# Patient Record
Sex: Female | Born: 1970
Health system: Southern US, Community
[De-identification: ages and names within clinical notes are randomized; demographics above are authoritative.]

## PROBLEM LIST (undated history)

## (undated) DIAGNOSIS — K921 Melena: Secondary | ICD-10-CM

## (undated) DIAGNOSIS — Z789 Other specified health status: Secondary | ICD-10-CM

## (undated) DIAGNOSIS — Z460 Encounter for fitting and adjustment of spectacles and contact lenses: Secondary | ICD-10-CM

## (undated) HISTORY — DX: Melena: K92.1

## (undated) HISTORY — PX: INCISION AND DRAINAGE ABSCESS: SHX5864

---

## 1999-04-14 ENCOUNTER — Emergency Department (HOSPITAL_COMMUNITY): Admission: EM | Admit: 1999-04-14 | Discharge: 1999-04-14 | Payer: Self-pay | Admitting: Emergency Medicine

## 1999-12-04 ENCOUNTER — Emergency Department (HOSPITAL_COMMUNITY): Admission: EM | Admit: 1999-12-04 | Discharge: 1999-12-04 | Payer: Self-pay | Admitting: Emergency Medicine

## 2000-05-21 ENCOUNTER — Encounter: Payer: Self-pay | Admitting: Emergency Medicine

## 2000-05-21 ENCOUNTER — Other Ambulatory Visit: Admission: RE | Admit: 2000-05-21 | Discharge: 2000-05-21 | Payer: Self-pay | Admitting: Internal Medicine

## 2000-05-21 ENCOUNTER — Emergency Department (HOSPITAL_COMMUNITY): Admission: EM | Admit: 2000-05-21 | Discharge: 2000-05-21 | Payer: Self-pay | Admitting: Emergency Medicine

## 2000-05-22 ENCOUNTER — Emergency Department (HOSPITAL_COMMUNITY): Admission: EM | Admit: 2000-05-22 | Discharge: 2000-05-22 | Payer: Self-pay | Admitting: Emergency Medicine

## 2000-05-28 ENCOUNTER — Emergency Department (HOSPITAL_COMMUNITY): Admission: EM | Admit: 2000-05-28 | Discharge: 2000-05-28 | Payer: Self-pay | Admitting: Emergency Medicine

## 2001-03-06 ENCOUNTER — Emergency Department (HOSPITAL_COMMUNITY): Admission: EM | Admit: 2001-03-06 | Discharge: 2001-03-07 | Payer: Self-pay | Admitting: Emergency Medicine

## 2001-05-02 ENCOUNTER — Other Ambulatory Visit: Admission: RE | Admit: 2001-05-02 | Discharge: 2001-05-02 | Payer: Self-pay | Admitting: Obstetrics and Gynecology

## 2002-06-10 ENCOUNTER — Other Ambulatory Visit: Admission: RE | Admit: 2002-06-10 | Discharge: 2002-06-10 | Payer: Self-pay | Admitting: Obstetrics and Gynecology

## 2004-07-10 ENCOUNTER — Other Ambulatory Visit: Admission: RE | Admit: 2004-07-10 | Discharge: 2004-07-10 | Payer: Self-pay | Admitting: Obstetrics and Gynecology

## 2006-11-08 ENCOUNTER — Encounter: Admission: RE | Admit: 2006-11-08 | Discharge: 2006-11-08 | Payer: Self-pay | Admitting: Internal Medicine

## 2006-11-26 HISTORY — PX: OTHER SURGICAL HISTORY: SHX169

## 2006-12-19 ENCOUNTER — Ambulatory Visit (HOSPITAL_COMMUNITY): Admission: RE | Admit: 2006-12-19 | Discharge: 2006-12-19 | Payer: Self-pay | Admitting: Obstetrics and Gynecology

## 2006-12-19 ENCOUNTER — Encounter (INDEPENDENT_AMBULATORY_CARE_PROVIDER_SITE_OTHER): Payer: Self-pay | Admitting: *Deleted

## 2008-05-04 ENCOUNTER — Emergency Department (HOSPITAL_COMMUNITY): Admission: EM | Admit: 2008-05-04 | Discharge: 2008-05-04 | Payer: Self-pay | Admitting: Family Medicine

## 2008-11-26 HISTORY — PX: DIAGNOSTIC LAPAROSCOPY: SUR761

## 2009-02-17 ENCOUNTER — Ambulatory Visit (HOSPITAL_COMMUNITY): Admission: RE | Admit: 2009-02-17 | Discharge: 2009-02-17 | Payer: Self-pay | Admitting: Obstetrics and Gynecology

## 2011-03-08 LAB — PREGNANCY, URINE: Preg Test, Ur: NEGATIVE

## 2011-03-08 LAB — CBC
HCT: 37.2 % (ref 36.0–46.0)
Hemoglobin: 12.3 g/dL (ref 12.0–15.0)
MCHC: 33.1 g/dL (ref 30.0–36.0)
MCV: 93.2 fL (ref 78.0–100.0)
Platelets: 267 10*3/uL (ref 150–400)
RBC: 3.99 MIL/uL (ref 3.87–5.11)
RDW: 13 % (ref 11.5–15.5)
WBC: 7.1 10*3/uL (ref 4.0–10.5)

## 2011-04-10 NOTE — Op Note (Signed)
NAMETINESHA, Rachel Graham         ACCOUNT NO.:  [id number]   MEDICAL RECORD NO.:  [id number]          PATIENT TYPE:  AMB   LOCATION:  SDC                           FACILITY:  [facility]   PHYSICIAN:  Maxie Better, M.D.DATE OF BIRTH:  [birth year]   DATE OF PROCEDURE:  02/17/2009  DATE OF DISCHARGE:                               OPERATIVE REPORT   PREOPERATIVE DIAGNOSES:  Persistent left lower quadrant pain, complex  left ovarian cyst, question endometrioma.   PROCEDURES:  Diagnostic laparoscopy, left ovarian cystotomy, lysis of  adhesions.   POSTOPERATIVE DIAGNOSES:  Left ovarian endometrioma, pelvic adhesions,  left lower quadrant pain.   ANESTHESIA:  General.   SURGEON.:  Maxie Better, MD   ASSISTANT:  Eliberto Ivory. Dickstein, MD   PROCEDURE IN DETAIL:  Under adequate general anesthesia, the patient was  placed in the dorsal lithotomy position.  She was sterilely prepped and  draped in the usual fashion.  Bladder was catheterized for large amount  of urine.  Examination under anesthesia revealed an anteverted uterus,  left adnexal fullness, right normal.  Bivalve speculum was placed in the  vagina.  Single-tooth tenaculum was placed on the anterior lip of the  cervix and acorn cannula was introduced into the cervical os and  attached to the tenaculum for manipulation of the uterus.  The bivalve  speculum was removed.  Attention was then turned to the abdomen, 0.25%  Marcaine was injected infraumbilically.  Infraumbilical incision was  then made.  Veress needle was introduced and tested with saline.  Carbon  dioxide was insufflated after opening pressure of 4 was noted, 3 L of  carbon dioxide was insufflated.  Veress needle was removed.  A 10-mm  disposable trocar with sleeve was introduced into the abdomen without  incident.  A lighted video laparoscope was then placed through that  port.  Panoramic inspection was then performed.  Normal liver edge  noted.  Normal appendix.   The uterus was normal.  Right tube and ovary  was normal.  Left ovary was enlarged with cystic mass.  A 0.25% Marcaine  was injected in the lower quadrants, right and left, and small incision  was made, 5-mm ports were placed under direct visualization to the both  sites.  At that point, the cervix was further inspected.  There is  partial obliteration of the posterior cul-de-sac on the left due to  adhesion of the rectosigmoid to the posterior aspect of the uterus along  with the left uterosacral.  The ovary was attached to the lateral aspect  of the uterus and also to the bowels.  Some adhesions around it was  lysed in order to help to try to facilitate and gave a little bit of  mobility.  However, due to the bowel and the fact that the patient not  prepped further, sharp dissection could not be performed without concern  for perforation of the bowels.  While that was being performed, the cyst  incidentally ruptured and large amount of chocolate fluid was removed.  The cyst cavity was inspected and there was no cyst wall to be removed  that could be easily  removed.  At that point, the cavity was irrigated  and checked for bleeding.  The rest of the pelvis had no evidence of  endometriosis visually.  The appendix again appeared normal.  The right  tube and ovary was normal.  The uterus itself was normal, and the  abdomen was then irrigated copiously, suctioned debris, and 30 mL of  0.25% Marcaine was instilled in the abdomen for pain management.  The  lower ports were then removed.  The abdomen was deflated.  The  infraumbilical site was removed.  The rectus fascia was identified and  grasped with Kocher and 0 Vicryl.  Figure-of-eight suture was then  placed.  The rest of the incisions  were then closed with subcuticular 4-0 Vicryl sutures.  The instruments  of the vagina was removed.  Specimen was none.  Estimated blood loss  minimal.  Complication was none.  The patient tolerated the  procedure  well and was transferred to recovery room in stable condition.      Maxie Better, M.D.  Electronically Signed     /MEDQ  D:  02/17/2009  T:  02/18/2009  Job:  [id number]

## 2011-04-13 NOTE — Op Note (Signed)
Rachel Graham, Rachel Graham         ACCOUNT NO.:  [id number]   MEDICAL RECORD NO.:  [id number]          PATIENT TYPE:  AMB   LOCATION:  SDC                           FACILITY:  [facility]   PHYSICIAN:  Maxie Better, M.D.DATE OF BIRTH:  1971-05-26   DATE OF PROCEDURE:  12/19/2006  DATE OF DISCHARGE:                               OPERATIVE REPORT   PREOPERATIVE DIAGNOSIS:  Dysfunctional uterine bleeding, endometrial  mass.   PROCEDURE:  Diagnostic hysteroscopy, hysteroscopic resection of  endometrial polyps, dilation and curettage.   POSTOPERATIVE DIAGNOSIS:  Dysfunctional uterine bleeding, endometrial  polyps.   ANESTHESIA:  General and paracervical block.   SURGEON:  Maxie Better, M.D.   PROCEDURE:  Under adequate general anesthesia, the patient is placed in  the dorsal lithotomy position.  She was sterilely prepped and draped in  usual fashion.  The bladder was catheterized for large amount of urine.  Bimanual examination revealed an anteverted uterus without any adnexal  masses appreciated.  Bivalve speculum was placed in the vagina.  One-  percent Nesacaine was injected paracervically at 3 and 9 o'clock.  The  anterior lip of the cervix was then grasped with a single-tooth  tenaculum.  The cervix was then serially dilated up to #23 [facility]  dilator.  The small diagnostic hysteroscope was introduced into the  uterine cavity.  Polypoid lesions were then noted, particularly from the  anterior wall of the uterine cavity.  The diagnostic hysteroscope was  then removed.  The cervix was then further dilated up to #25 [facility]  dilator, and the resectoscope was introduced.  Polypoid lesions were  resected.  The tubal ostia could be seen.  The resectoscope was removed.  The cavity was then curetted.  Resectoscope was then reinserted.  The  endometrial cavity showed no lesions.  On pulling out toward the lower  uterine segment on the right, there was a polypoid lesion.  This was  then  resected.  Further pulling out of the resectoscope into the  endocervical canal did not reveal any other lesions.  The resectoscope  was then removed.  The cavity was then curetted.  All instruments were  then removed from the vagina.  Specimen labeled endometrial polyps and  endometrial curetting was sent to pathology.  Estimated blood loss was  minimal.  Fluid deficit was 195 mL.  Complication was none.  The patient  tolerated the procedure well, was transferred to recovery room in stable  condition.      Maxie Better, M.D.  Electronically Signed     Yarnell/MEDQ  D:  12/19/2006  T:  12/19/2006  Job:  [id number]   cc:   Merlene Laughter. Renae Gloss, M.D.  Fax: [phone]

## 2011-05-18 ENCOUNTER — Other Ambulatory Visit: Payer: Self-pay | Admitting: Obstetrics and Gynecology

## 2011-08-23 LAB — POCT URINALYSIS DIP (DEVICE)
Glucose, UA: NEGATIVE
Operator id: 247071
Protein, ur: 30 — AB
Specific Gravity, Urine: 1.02
Urobilinogen, UA: 1

## 2012-12-09 ENCOUNTER — Encounter (HOSPITAL_COMMUNITY): Payer: Self-pay | Admitting: Emergency Medicine

## 2012-12-09 ENCOUNTER — Emergency Department (INDEPENDENT_AMBULATORY_CARE_PROVIDER_SITE_OTHER): Payer: 59

## 2012-12-09 ENCOUNTER — Emergency Department (HOSPITAL_COMMUNITY)
Admission: EM | Admit: 2012-12-09 | Discharge: 2012-12-09 | Disposition: A | Discharge status: Home / Self Care | Payer: 59 | Source: Home / Self Care | Attending: Emergency Medicine | Admitting: Emergency Medicine

## 2012-12-09 DIAGNOSIS — S39012A Strain of muscle, fascia and tendon of lower back, initial encounter: Secondary | ICD-10-CM

## 2012-12-09 DIAGNOSIS — S335XXA Sprain of ligaments of lumbar spine, initial encounter: Secondary | ICD-10-CM

## 2012-12-09 LAB — POCT URINALYSIS DIP (DEVICE)
Bilirubin Urine: NEGATIVE
Glucose, UA: NEGATIVE mg/dL
Nitrite: NEGATIVE
Urobilinogen, UA: 0.2 mg/dL (ref 0.0–1.0)

## 2012-12-09 MED ORDER — METHOCARBAMOL 500 MG PO TABS: 500.0000 mg | ORAL_TABLET | Freq: Three times a day (TID) | ORAL | Status: DC

## 2012-12-09 MED ORDER — MELOXICAM 15 MG PO TABS: 15.0000 mg | ORAL_TABLET | Freq: Every day | ORAL | Status: DC

## 2012-12-09 MED ORDER — TRAMADOL HCL 50 MG PO TABS: 100.0000 mg | ORAL_TABLET | Freq: Three times a day (TID) | ORAL | Status: DC | PRN

## 2012-12-09 NOTE — ED Notes (Signed)
Pt c/o severe lower back pain over the past month off/on. Pt states the past two weeks it has seem to be a lot worse. Pt denies any urinary symptoms. Pt does state that urine is dark in color.  Pt has tried otc pain meds with no relief in symptoms.   Denies hx of uti"s.

## 2012-12-09 NOTE — ED Provider Notes (Signed)
Chief Complaint  Patient presents with  . Back Pain    severe lower back pain x 1 month on/off. last two weeks seems alot worse.     History of Present Illness:   Rachel Graham is a 42 year old female who has had a one half month history of lower back pain which radiates down both legs as far as the knees. It's been worse the past 2 weeks. She denies any injury. 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 She is sitting most of the day, doing computer work. The pain is worse with any type of movement, bending, lifting, twisting, or bending. It's better if she lies flat on her back or walks. Her legs feel a little weak but she denies any numbness or tingling. She's had no dysuria, frequency, hematuria, or incontinence of bladder, bowel, or saddle anesthesia. She denies any abdominal pain. She's had no fever, chills, unexplained weight loss, history of cancer, history of immunosuppression, osteoporosis.  Review of Systems:  Other than noted above, the patient denies any of the following symptoms: Systemic:  No fever, chills, severe fatigue, or unexplained weight loss. GI:  No abdominal pain, nausea, vomiting, diarrhea, constipation, incontinence of bowel, or blood in stool. GU:  No dysuria, frequency, urgency, or hematuria. No incontinence of urine or difficulty urinating.  M-S:  No neck pain, joint pain, arthritis, or myalgias. Neuro:  No paresthesias, saddle anesthesia, muscular weakness, or progressive neurological deficit.  PMFSH:  Past medical history, family history, social history, meds, and allergies were reviewed. Specifically, there is no history of cancer, major trauma, osteoporosis, immunosuppression, HIV, or IV or injection drug use.   Physical Exam:   Vital signs:  LMP 12/05/2012 General:  Alert, oriented, in no distress. Abdomen:  Soft, non-tender.  No organomegaly or mass.  No pulsatile midline abdominal mass or bruit. Back:  There is  moderate tenderness to palpation in the paravertebral muscles just above the iliac crest bilaterally. There was no midline tenderness to palpation. She has 110 of flexion, 10 of extension, 20 of lateral bending, and 45 of rotation, with not much pain on flexion but, extension, lateral bending, and rotation. Straight leg raising on the left produces some lower back pain but no radiating pain. Lasegue's sign and popliteal compression are negative. Straight leg raising on the right was negative. Neuro:  Normal muscle strength, sensations and DTRs. Extremities: Pedal pulses were full, there was no edema. Skin:  Clear, warm and dry.  No rash.  Labs:   Results for orders placed during the hospital encounter of 12/09/12  POCT URINALYSIS DIP (DEVICE)      Component Value Range   Glucose, UA NEGATIVE  NEGATIVE mg/dL   Bilirubin Urine NEGATIVE  NEGATIVE   Ketones, ur NEGATIVE  NEGATIVE mg/dL   Specific Gravity, Urine 1.015  1.005 - 1.030   Hgb urine dipstick SMALL (*) NEGATIVE   pH 5.5  5.0 - 8.0   Protein, ur NEGATIVE  NEGATIVE mg/dL   Urobilinogen, UA 0.2  0.0 - 1.0 mg/dL   Nitrite NEGATIVE  NEGATIVE   Leukocytes, UA NEGATIVE  NEGATIVE     Radiology:  Dg Lumbar Spine Complete  12/09/2012  *RADIOLOGY REPORT*  Clinical Data: Low back pain  LUMBAR SPINE - COMPLETE 4+ VIEW  Comparison: None.  Findings: Frontal, lateral, bilateral oblique, and spot lumbosacral lateral images were obtained.  There are five non-rib bearing lumbar type vertebral bodies.  There is no fracture or  spondylolisthesis.  Disc spaces appear intact.  There is no appreciable facet arthropathy.  Impression:  No fracture.  No appreciable arthropathy.   Original Report Authenticated By: Bretta Bang, M.D.     Assessment:  The encounter diagnosis was Lumbar strain.  Plan:   1.  The following meds were prescribed:   New Prescriptions   MELOXICAM (MOBIC) 15 MG TABLET    Take 1 tablet (15 mg total) by mouth daily.    METHOCARBAMOL (ROBAXIN) 500 MG TABLET    Take 1 tablet (500 mg total) by mouth 3 (three) times daily.   TRAMADOL (ULTRAM) 50 MG TABLET    Take 2 tablets (100 mg total) by mouth every 8 (eight) hours as needed for pain.   2.  The patient was instructed in symptomatic care and handouts were given. 3.  The patient was told to return if becoming worse in any way, if no better in 2 weeks, and given some red flag symptoms that would indicate earlier return. 4.  The patient was encouraged to try to be as active as possible and given some exercises to do followed by moist heat.  Follow up:  The patient was told to follow up with Dr. Jonell Cluck in one week if no improvement.     Reuben Likes, MD 12/09/12 [phone]

## 2012-12-10 LAB — URINE CULTURE: Colony Count: 25000

## 2012-12-23 ENCOUNTER — Emergency Department (HOSPITAL_COMMUNITY)
Admission: EM | Admit: 2012-12-23 | Discharge: 2012-12-23 | Disposition: A | Discharge status: Home / Self Care | Payer: 59 | Source: Home / Self Care | Attending: Emergency Medicine | Admitting: Emergency Medicine

## 2012-12-23 ENCOUNTER — Encounter (HOSPITAL_COMMUNITY): Payer: Self-pay | Admitting: *Deleted

## 2012-12-23 DIAGNOSIS — H00019 Hordeolum externum unspecified eye, unspecified eyelid: Secondary | ICD-10-CM

## 2012-12-23 MED ORDER — TOBRAMYCIN 0.3 % OP SOLN: 1.0000 [drp] | OPHTHALMIC | Status: DC

## 2012-12-23 MED ORDER — CEPHALEXIN 500 MG PO CAPS: 500.0000 mg | ORAL_CAPSULE | Freq: Three times a day (TID) | ORAL | Status: DC

## 2012-12-23 MED ORDER — ERYTHROMYCIN 5 MG/GM OP OINT: TOPICAL_OINTMENT | Freq: Every day | OPHTHALMIC | Status: DC

## 2012-12-23 NOTE — ED Notes (Signed)
Pt reports swelling , redness and pustule on left eye for the past week with no relief from hot compresses

## 2012-12-23 NOTE — ED Provider Notes (Signed)
Chief Complaint  Patient presents with  . Eye Problem    History of Present Illness:   Rachel Graham is a 42 year old female who presents today with a one-week history of a painful papule on her left upper eyelid. This was not draining any pus. Her vision is normal. The globe itself is normal with no injection or erythema of the conjunctiva. There is no discharge in the conjunctiva. The right is normal. She's never had anything like this before.  Review of Systems:  Other than noted above, the patient denies any of the following symptoms: Systemic:  No fever, chills, sweats, fatigue, or weight loss. Eye:  No redness, eye pain, photophobia, discharge, blurred vision, or diplopia. ENT:  No nasal congestion, rhinorrhea, or sore throat. Lymphatic:  No adenopathy. Skin:  No rash or pruritis.  PMFSH:  Past medical history, family history, social history, meds, and allergies were reviewed.  Physical Exam:   Vital signs:  BP 140/89  Pulse 84  Temp 96.9 F (36.1 C) (Oral)  Resp 18  SpO2 99%  LMP 12/05/2012 General:  Alert and in no distress. Eye:  There is a large, firm, erythematous papule on the mid left upper eyelid. This was not fluctuant or draining pus. The periorbital tissues are normal, conjunctiva is normal with no injection or erythema, no discharge or drainage in the conjunctival sac. The cornea is intact, anterior chambers normal, PERRLA, full EOMs. ENT:  TMs and canals clear.  Nasal mucosa normal.  No intra-oral lesions, mucous membranes moist, pharynx clear. Neck:  No adenopathy tenderness or mass. Skin:  Clear, warm and dry.  Assessment:  The encounter diagnosis was Hordeolum.  Plan:   1.  The following meds were prescribed:   New Prescriptions   CEPHALEXIN (KEFLEX) 500 MG CAPSULE    Take 1 capsule (500 mg total) by mouth 3 (three) times daily.   ERYTHROMYCIN OPHTHALMIC OINTMENT    Place into the left eye at bedtime.   TOBRAMYCIN (TOBREX) 0.3 % OPHTHALMIC SOLUTION     Place 1 drop into the left eye every 4 (four) hours.   2.  The patient was instructed in symptomatic care and handouts were given. 3.  The patient was told to return if becoming worse in any way, if no better in 3 or 4 days, and given some red flag symptoms that would indicate earlier return.  Follow up:  The patient was told to follow up with Dr. Randon Goldsmith if no improvement in one week.      Reuben Likes, MD 12/23/12 1051

## 2012-12-31 ENCOUNTER — Emergency Department (HOSPITAL_COMMUNITY)
Admission: EM | Admit: 2012-12-31 | Discharge: 2012-12-31 | Disposition: A | Discharge status: Home / Self Care | Payer: 59 | Source: Home / Self Care | Attending: Family Medicine | Admitting: Family Medicine

## 2012-12-31 ENCOUNTER — Encounter (HOSPITAL_COMMUNITY): Payer: Self-pay | Admitting: *Deleted

## 2012-12-31 DIAGNOSIS — L02419 Cutaneous abscess of limb, unspecified: Secondary | ICD-10-CM

## 2012-12-31 DIAGNOSIS — IMO0002 Reserved for concepts with insufficient information to code with codable children: Secondary | ICD-10-CM

## 2012-12-31 NOTE — ED Notes (Signed)
Boil under left arm x 4 days - started draining yesterday.  Went to [facility]. Today for stye left eye - he told her to come here and get her underarm cultured and to have the "core" removed

## 2012-12-31 NOTE — ED Provider Notes (Signed)
History     CSN: [id number]  Arrival date & time 12/31/12  1521   First MD Initiated Contact with Patient 12/31/12 1603      Chief Complaint  Patient presents with  . Recurrent Skin Infections    (Consider location/radiation/quality/duration/timing/severity/associated sxs/prior treatment) Patient is a 42 y.o. female presenting with abscess. The history is provided by the patient.  Abscess  This is a recurrent problem. The current episode started less than one week ago. The onset was gradual. The problem has been gradually worsening. The abscess is present on the right arm. The problem is mild. The abscess is characterized by draining.    History reviewed. No pertinent past medical history.  Past Surgical History  Procedure Date  . Fibroids surgery     History reviewed. No pertinent family history.  History  Substance Use Topics  . Smoking status: Never Smoker   . Smokeless tobacco: Not on file  . Alcohol Use: Yes     Comment: occasional    OB History    Grav Para Term Preterm Abortions TAB SAB Ect Mult Living                  Review of Systems  Constitutional: Negative.     Allergies  Review of patient's allergies indicates no known allergies.  Home Medications   Current Outpatient Rx  Name  Route  Sig  Dispense  Refill  . CEPHALEXIN 500 MG PO CAPS   Oral   Take 1 capsule (500 mg total) by mouth 3 (three) times daily.   30 capsule   0   . ERYTHROMYCIN 5 MG/GM OP OINT   Left Eye   Place into the left eye at bedtime.   3.5 g   0   . TOBRAMYCIN SULFATE 0.3 % OP SOLN   Left Eye   Place 1 drop into the left eye every 4 (four) hours.   5 mL   0   . MELOXICAM 15 MG PO TABS   Oral   Take 1 tablet (15 mg total) by mouth daily.   15 tablet   0   . METHOCARBAMOL 500 MG PO TABS   Oral   Take 1 tablet (500 mg total) by mouth 3 (three) times daily.   30 tablet   0   . TRAMADOL HCL 50 MG PO TABS   Oral   Take 2 tablets (100 mg total) by mouth  every 8 (eight) hours as needed for pain.   30 tablet   0     BP 128/85  Pulse 66  Temp 99.4 F (37.4 C) (Oral)  SpO2 95%  LMP 12/05/2012  Physical Exam  Nursing note and vitals reviewed. Constitutional: She is oriented to person, place, and time. She appears well-developed and well-nourished. No distress.  Eyes: Conjunctivae normal and EOM are normal. Pupils are equal, round, and reactive to light.       Hordeolum left upper lid.  Neurological: She is alert and oriented to person, place, and time.  Skin: Skin is warm and dry.       Tiny droplet of fluid expressed from right axillary abscess area.    ED Course  Procedures (including critical care time)   Labs Reviewed  CULTURE, ROUTINE-ABSCESS   No results found.   1. Abscess of axilla       MDM          Linna Hoff, MD 12/31/12 [id number]

## 2013-01-03 LAB — CULTURE, ROUTINE-ABSCESS

## 2013-01-14 NOTE — ED Notes (Signed)
Abscess culture R axilla: Few Staph. species (coagulase neg.).  Message sent to Dr. Artis Flock. 2/21 Dr. Artis Flock said it was "OK." Rachel Graham 01/16/2013

## 2013-10-31 ENCOUNTER — Ambulatory Visit: Payer: 59

## 2014-04-21 ENCOUNTER — Other Ambulatory Visit: Payer: Self-pay | Admitting: Orthopedic Surgery

## 2014-04-22 ENCOUNTER — Encounter (HOSPITAL_BASED_OUTPATIENT_CLINIC_OR_DEPARTMENT_OTHER): Payer: Self-pay | Admitting: *Deleted

## 2014-04-22 NOTE — Progress Notes (Signed)
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

## 2014-04-27 ENCOUNTER — Ambulatory Visit (HOSPITAL_BASED_OUTPATIENT_CLINIC_OR_DEPARTMENT_OTHER): Payer: 59 | Admitting: Certified Registered"

## 2014-04-27 ENCOUNTER — Encounter (HOSPITAL_BASED_OUTPATIENT_CLINIC_OR_DEPARTMENT_OTHER): Payer: Self-pay | Admitting: *Deleted

## 2014-04-27 ENCOUNTER — Ambulatory Visit (HOSPITAL_BASED_OUTPATIENT_CLINIC_OR_DEPARTMENT_OTHER)
Admission: RE | Admit: 2014-04-27 | Discharge: 2014-04-27 | Disposition: A | Discharge status: Home / Self Care | Payer: 59 | Source: Ambulatory Visit | Attending: Orthopedic Surgery | Admitting: Orthopedic Surgery

## 2014-04-27 ENCOUNTER — Encounter (HOSPITAL_BASED_OUTPATIENT_CLINIC_OR_DEPARTMENT_OTHER): Payer: 59 | Admitting: Certified Registered"

## 2014-04-27 ENCOUNTER — Encounter (HOSPITAL_BASED_OUTPATIENT_CLINIC_OR_DEPARTMENT_OTHER)
Admission: RE | Disposition: A | Discharge status: Home / Self Care | Payer: Self-pay | Source: Ambulatory Visit | Attending: Orthopedic Surgery

## 2014-04-27 DIAGNOSIS — M6740 Ganglion, unspecified site: Secondary | ICD-10-CM | POA: Insufficient documentation

## 2014-04-27 HISTORY — PX: GANGLION CYST EXCISION: SHX1691

## 2014-04-27 HISTORY — DX: Encounter for fitting and adjustment of spectacles and contact lenses: Z46.0

## 2014-04-27 HISTORY — DX: Other specified health status: Z78.9

## 2014-04-27 LAB — POCT HEMOGLOBIN-HEMACUE: Hemoglobin: 12.9 g/dL (ref 12.0–15.0)

## 2014-04-27 SURGERY — EXCISION, GANGLION CYST, WRIST
Anesthesia: General | Site: Wrist | Laterality: Right

## 2014-04-27 MED ORDER — DEXAMETHASONE SODIUM PHOSPHATE 10 MG/ML IJ SOLN
INTRAMUSCULAR | Status: DC | PRN
  Administered 2014-04-27: 10 mg via INTRAVENOUS

## 2014-04-27 MED ORDER — FENTANYL CITRATE 0.05 MG/ML IJ SOLN
INTRAMUSCULAR | Status: DC | PRN
  Administered 2014-04-27 (×2): 25 ug via INTRAVENOUS
  Administered 2014-04-27: 50 ug via INTRAVENOUS
  Administered 2014-04-27: 25 ug via INTRAVENOUS
  Administered 2014-04-27: 50 ug via INTRAVENOUS
  Administered 2014-04-27: 25 ug via INTRAVENOUS

## 2014-04-27 MED ORDER — FENTANYL CITRATE 0.05 MG/ML IJ SOLN
INTRAMUSCULAR | Status: AC
  Filled 2014-04-27: qty 6

## 2014-04-27 MED ORDER — HYDROMORPHONE HCL PF 1 MG/ML IJ SOLN
0.2500 mg | INTRAMUSCULAR | Status: DC | PRN
  Administered 2014-04-27 (×2): 0.5 mg via INTRAVENOUS

## 2014-04-27 MED ORDER — LACTATED RINGERS IV SOLN
INTRAVENOUS | Status: DC
  Administered 2014-04-27 (×2): via INTRAVENOUS

## 2014-04-27 MED ORDER — CEFAZOLIN SODIUM-DEXTROSE 2-3 GM-% IV SOLR
2.0000 g | INTRAVENOUS | Status: AC
  Administered 2014-04-27: 2 g via INTRAVENOUS

## 2014-04-27 MED ORDER — OXYCODONE HCL 5 MG PO TABS
5.0000 mg | ORAL_TABLET | Freq: Once | ORAL | Status: AC | PRN
  Administered 2014-04-27: 5 mg via ORAL

## 2014-04-27 MED ORDER — MIDAZOLAM HCL 2 MG/2ML IJ SOLN: 1.0000 mg | INTRAMUSCULAR | Status: DC | PRN

## 2014-04-27 MED ORDER — LIDOCAINE HCL (CARDIAC) 20 MG/ML IV SOLN
INTRAVENOUS | Status: DC | PRN
  Administered 2014-04-27: 60 mg via INTRAVENOUS

## 2014-04-27 MED ORDER — BUPIVACAINE HCL (PF) 0.25 % IJ SOLN
INTRAMUSCULAR | Status: DC | PRN
  Administered 2014-04-27: 5 mL

## 2014-04-27 MED ORDER — FENTANYL CITRATE 0.05 MG/ML IJ SOLN: 50.0000 ug | INTRAMUSCULAR | Status: DC | PRN

## 2014-04-27 MED ORDER — PROPOFOL 10 MG/ML IV BOLUS
INTRAVENOUS | Status: DC | PRN
  Administered 2014-04-27: 180 mg via INTRAVENOUS

## 2014-04-27 MED ORDER — ONDANSETRON HCL 4 MG/2ML IJ SOLN: 4.0000 mg | Freq: Once | INTRAMUSCULAR | Status: DC | PRN

## 2014-04-27 MED ORDER — HYDROMORPHONE HCL PF 1 MG/ML IJ SOLN
INTRAMUSCULAR | Status: AC
  Filled 2014-04-27: qty 1

## 2014-04-27 MED ORDER — CHLORHEXIDINE GLUCONATE 4 % EX LIQD: 60.0000 mL | Freq: Once | CUTANEOUS | Status: DC

## 2014-04-27 MED ORDER — OXYCODONE HCL 5 MG/5ML PO SOLN: 5.0000 mg | Freq: Once | ORAL | Status: AC | PRN

## 2014-04-27 MED ORDER — OXYCODONE HCL 5 MG PO TABS
ORAL_TABLET | ORAL | Status: AC
  Filled 2014-04-27: qty 1

## 2014-04-27 MED ORDER — MIDAZOLAM HCL 2 MG/2ML IJ SOLN
INTRAMUSCULAR | Status: AC
  Filled 2014-04-27: qty 2

## 2014-04-27 MED ORDER — MIDAZOLAM HCL 5 MG/5ML IJ SOLN
INTRAMUSCULAR | Status: DC | PRN
  Administered 2014-04-27: 2 mg via INTRAVENOUS

## 2014-04-27 MED ORDER — OXYCODONE-ACETAMINOPHEN 5-325 MG PO TABS: ORAL_TABLET | ORAL | Status: DC

## 2014-04-27 MED ORDER — CEFAZOLIN SODIUM-DEXTROSE 2-3 GM-% IV SOLR
INTRAVENOUS | Status: AC
  Filled 2014-04-27: qty 50

## 2014-04-27 MED ORDER — ONDANSETRON HCL 4 MG/2ML IJ SOLN
INTRAMUSCULAR | Status: DC | PRN
  Administered 2014-04-27: 4 mg via INTRAVENOUS

## 2014-04-27 SURGICAL SUPPLY — 53 items
APL SKNCLS STERI-STRIP NONHPOA (GAUZE/BANDAGES/DRESSINGS) ×1
BANDAGE ELASTIC 3 VELCRO ST LF (GAUZE/BANDAGES/DRESSINGS) ×3 IMPLANT
BENZOIN TINCTURE PRP APPL 2/3 (GAUZE/BANDAGES/DRESSINGS) ×2 IMPLANT
BLADE MINI RND TIP GREEN BEAV (BLADE) IMPLANT
BLADE SURG 15 STRL LF DISP TIS (BLADE) ×2 IMPLANT
BLADE SURG 15 STRL SS (BLADE) ×6
BNDG CMPR 9X4 STRL LF SNTH (GAUZE/BANDAGES/DRESSINGS) ×1
BNDG CMPR MD 5X2 ELC HKLP STRL (GAUZE/BANDAGES/DRESSINGS)
BNDG ELASTIC 2 VLCR STRL LF (GAUZE/BANDAGES/DRESSINGS) IMPLANT
BNDG ESMARK 4X9 LF (GAUZE/BANDAGES/DRESSINGS) ×2 IMPLANT
BNDG GAUZE ELAST 4 BULKY (GAUZE/BANDAGES/DRESSINGS) ×3 IMPLANT
CHLORAPREP W/TINT 26ML (MISCELLANEOUS) ×3 IMPLANT
CLOSURE WOUND 1/2 X4 (GAUZE/BANDAGES/DRESSINGS) ×1
CORDS BIPOLAR (ELECTRODE) ×3 IMPLANT
COVER MAYO STAND STRL (DRAPES) ×3 IMPLANT
COVER TABLE BACK 60X90 (DRAPES) ×3 IMPLANT
CUFF TOURNIQUET SINGLE 18IN (TOURNIQUET CUFF) ×3 IMPLANT
DRAPE EXTREMITY T 121X128X90 (DRAPE) ×3 IMPLANT
DRAPE SURG 17X23 STRL (DRAPES) ×3 IMPLANT
DRSG PAD ABDOMINAL 8X10 ST (GAUZE/BANDAGES/DRESSINGS) IMPLANT
GAUZE SPONGE 4X4 12PLY STRL (GAUZE/BANDAGES/DRESSINGS) ×3 IMPLANT
GAUZE XEROFORM 1X8 LF (GAUZE/BANDAGES/DRESSINGS) ×3 IMPLANT
GLOVE BIO SURGEON STRL SZ7 (GLOVE) ×2 IMPLANT
GLOVE BIO SURGEON STRL SZ7.5 (GLOVE) ×3 IMPLANT
GLOVE BIOGEL PI IND STRL 7.0 (GLOVE) IMPLANT
GLOVE BIOGEL PI IND STRL 8 (GLOVE) ×1 IMPLANT
GLOVE BIOGEL PI IND STRL 8.5 (GLOVE) IMPLANT
GLOVE BIOGEL PI INDICATOR 7.0 (GLOVE) ×4
GLOVE BIOGEL PI INDICATOR 8 (GLOVE) ×2
GLOVE BIOGEL PI INDICATOR 8.5 (GLOVE) ×2
GLOVE ECLIPSE 7.0 STRL STRAW (GLOVE) ×2 IMPLANT
GOWN SPEC L3 XXLG W/TWL (GOWN DISPOSABLE) ×5 IMPLANT
GOWN STRL REUS W/ TWL LRG LVL3 (GOWN DISPOSABLE) ×1 IMPLANT
GOWN STRL REUS W/TWL LRG LVL3 (GOWN DISPOSABLE) ×6
NDL HYPO 25X1 1.5 SAFETY (NEEDLE) IMPLANT
NEEDLE HYPO 25X1 1.5 SAFETY (NEEDLE) ×3 IMPLANT
NS IRRIG 1000ML POUR BTL (IV SOLUTION) ×3 IMPLANT
PACK BASIN DAY SURGERY FS (CUSTOM PROCEDURE TRAY) ×3 IMPLANT
PAD CAST 3X4 CTTN HI CHSV (CAST SUPPLIES) IMPLANT
PADDING CAST ABS 4INX4YD NS (CAST SUPPLIES) ×2
PADDING CAST ABS COTTON 4X4 ST (CAST SUPPLIES) ×1 IMPLANT
PADDING CAST COTTON 3X4 STRL (CAST SUPPLIES)
SPLINT PLASTER CAST XFAST 3X15 (CAST SUPPLIES) IMPLANT
SPLINT PLASTER XTRA FASTSET 3X (CAST SUPPLIES)
STOCKINETTE 4X48 STRL (DRAPES) ×3 IMPLANT
STRIP CLOSURE SKIN 1/2X4 (GAUZE/BANDAGES/DRESSINGS) ×1 IMPLANT
SUT MNCRL AB 4-0 PS2 18 (SUTURE) IMPLANT
SUT MON AB 5-0 PS2 18 (SUTURE) ×2 IMPLANT
SUT VIC AB 4-0 P2 18 (SUTURE) IMPLANT
SYR BULB 3OZ (MISCELLANEOUS) ×3 IMPLANT
SYR CONTROL 10ML LL (SYRINGE) ×2 IMPLANT
TOWEL OR 17X24 6PK STRL BLUE (TOWEL DISPOSABLE) ×6 IMPLANT
UNDERPAD 30X30 INCONTINENT (UNDERPADS AND DIAPERS) ×3 IMPLANT

## 2014-04-27 NOTE — Anesthesia Postprocedure Evaluation (Signed)
  Anesthesia Post-op Note  Patient: Rachel Graham  Procedure(s) Performed: Procedure(s): RIGHT WRIST EXCISION OF MASS  (Right)  Patient Location: PACU  Anesthesia Type:General  Level of Consciousness: awake, alert  and oriented  Airway and Oxygen Therapy: Patient Spontanous Breathing and Patient connected to face mask oxygen  Post-op Pain: mild  Post-op Assessment: Post-op Vital signs reviewed  Post-op Vital Signs: Reviewed  Last Vitals:  Filed Vitals:   04/27/14 1200  BP: 146/92  Pulse: 74  Temp:   Resp: 14    Complications: No apparent anesthesia complications

## 2014-04-27 NOTE — Anesthesia Preprocedure Evaluation (Signed)
Anesthesia Evaluation  Patient identified by MRN, date of birth, ID band Patient awake    Reviewed: Allergy & Precautions, H&P , NPO status , Patient's Chart, lab work & pertinent test results  Airway Mallampati: I TM Distance: >3 FB Neck ROM: Full    Dental  (+) Teeth Intact, Dental Advisory Given   Pulmonary  breath sounds clear to auscultation        Cardiovascular Rhythm:Regular Rate:Normal     Neuro/Psych    GI/Hepatic   Endo/Other    Renal/GU      Musculoskeletal   Abdominal   Peds  Hematology   Anesthesia Other Findings   Reproductive/Obstetrics                           Anesthesia Physical Anesthesia Plan  ASA: I  Anesthesia Plan: General   Post-op Pain Management:    Induction: Intravenous  Airway Management Planned: LMA  Additional Equipment:   Intra-op Plan:   Post-operative Plan: Extubation in OR  Informed Consent: I have reviewed the patients History and Physical, chart, labs and discussed the procedure including the risks, benefits and alternatives for the proposed anesthesia with the patient or authorized representative who has indicated his/her understanding and acceptance.   Dental advisory given  Plan Discussed with: CRNA and Anesthesiologist  Anesthesia Plan Comments:         Anesthesia Quick Evaluation

## 2014-04-27 NOTE — Brief Op Note (Signed)
04/27/2014  11:08 AM  PATIENT:  Rachel Graham  43 y.o. female  PRE-OPERATIVE DIAGNOSIS:  right wrist ganglion  POST-OPERATIVE DIAGNOSIS:  right wrist ganglion  PROCEDURE:  Procedure(s): RIGHT WRIST EXCISION OF MASS  (Right)  SURGEON:  Surgeon(s) and Role:    * Tennis Must, MD - Primary    * Wynonia Sours, MD - Assisting  PHYSICIAN ASSISTANT:   ASSISTANTS: Daryll Brod, MD   ANESTHESIA:   general  EBL:  Total I/O In: 1500 [I.V.:1500] Out: -   BLOOD ADMINISTERED:none  DRAINS: none   LOCAL MEDICATIONS USED:  MARCAINE     SPECIMEN:  Source of Specimen:  right wrist  DISPOSITION OF SPECIMEN:  PATHOLOGY  COUNTS:  YES  TOURNIQUET:  * Missing tourniquet times found for documented tourniquets in log:  [id number] *  DICTATION: .Other Dictation: Dictation Number [id number]  PLAN OF CARE: Discharge to home after PACU  PATIENT DISPOSITION:  PACU - hemodynamically stable.

## 2014-04-27 NOTE — Op Note (Signed)
[id number] 

## 2014-04-27 NOTE — Transfer of Care (Signed)
Immediate Anesthesia Transfer of Care Note  Patient: Rachel Graham  Procedure(s) Performed: Procedure(s): RIGHT WRIST EXCISION OF MASS  (Right)  Patient Location: PACU  Anesthesia Type:General  Level of Consciousness: sedated  Airway & Oxygen Therapy: Patient Spontanous Breathing and Patient connected to face mask oxygen  Post-op Assessment: Report given to PACU RN and Post -op Vital signs reviewed and stable  Post vital signs: Reviewed and stable  Complications: No apparent anesthesia complications

## 2014-04-27 NOTE — Anesthesia Procedure Notes (Signed)
Procedure Name: LMA Insertion Date/Time: 04/27/2014 10:29 AM Performed by: Chattie Greeson Pre-anesthesia Checklist: Patient identified, Emergency Drugs available, Suction available and Patient being monitored Patient Re-evaluated:Patient Re-evaluated prior to inductionOxygen Delivery Method: Circle System Utilized Preoxygenation: Pre-oxygenation with 100% oxygen Intubation Type: IV induction Ventilation: Mask ventilation without difficulty LMA: LMA inserted LMA Size: 4.0 Number of attempts: 1 Airway Equipment and Method: bite block Placement Confirmation: positive ETCO2 Tube secured with: Tape Dental Injury: Teeth and Oropharynx as per pre-operative assessment

## 2014-04-27 NOTE — Discharge Instructions (Addendum)

## 2014-04-27 NOTE — H&P (Signed)
  Rachel Graham is an 43 y.o. female.   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  Painful when she bumps it.  It is bothersome to her.  She wishes to have it removed.  Past Medical History  Diagnosis Date  . Medical history non-contributory   . Contact lens/glasses fitting     wears glases or contacts    Past Surgical History  Procedure Laterality Date  . Fibroids surgery  2008    d/c hysteroscopy  . Diagnostic laparoscopy  2010    lt ovarian cyst-LOA    History reviewed. No pertinent family history. Social History:  reports that she has never smoked. She does not have any smokeless tobacco history on file. She reports that she drinks alcohol. She reports that she does not use illicit drugs.  Allergies: No Known Allergies  Medications Prior to Admission  Medication Sig Dispense Refill  . cephALEXin (KEFLEX) 500 MG capsule Take 1 capsule (500 mg total) by mouth 3 (three) times daily.  30 capsule  0    No results found for this or any previous visit (from the past 48 hour(s)).  No results found.   A comprehensive review of systems was negative except for: Eyes: positive for contacts/glasses  Height 5\' 3"  (1.6 m), weight 90.719 kg (200 lb), last menstrual period 04/08/2014.  General appearance: alert, cooperative and appears stated age Head: Normocephalic, without obvious abnormality, atraumatic Neck: supple, symmetrical, trachea midline Resp: clear to auscultation bilaterally Cardio: regular rate and rhythm GI: non tender Extremities: intact sensation and capillary refill all digits.  +epl/fpl/io.  mass on dorsum right wrist..  no skin changes. Pulses: 2+ and symmetric Skin: Skin color, texture, turgor normal. No rashes or lesions Neurologic: Grossly normal Incision/Wound: none  Assessment/Plan Right wrist dorsal ganglion cyst.  Non operative and operative treatment options were discussed with the patient and  patient wishes to proceed with operative treatment. Risks, benefits, and alternatives of surgery were discussed and the patient agrees with the plan of care.   Tennis Must 04/27/2014, 8:32 AM

## 2014-04-28 NOTE — Addendum Note (Signed)
Addendum created 04/28/14 1158 by Tawni Millers, CRNA   Modules edited: Charges VN

## 2014-04-28 NOTE — Op Note (Signed)
[name], Graham         ACCOUNT NO.:  [id number]  MEDICAL RECORD NO.:  [id number]  LOCATION:                                 FACILITY:  PHYSICIAN:  Leanora Cover, MD             DATE OF BIRTH:  DATE OF PROCEDURE: DATE OF DISCHARGE:                              OPERATIVE REPORT   PREOPERATIVE DIAGNOSIS:  Right wrist dorsal ganglion cyst.  POSTOPERATIVE DIAGNOSIS:  Right wrist dorsal ganglion cyst.  PROCEDURE:  Right wrist excision dorsal ganglion cyst.  SURGEON:  Leanora Cover, MD  ASSISTANT:  Daryll Brod, MD  ANESTHESIA:  General.  IV FLUIDS:  Per anesthesia flow sheet.  ESTIMATED BLOOD LOSS:  Minimal.  COMPLICATIONS:  None.  SPECIMENS:  Right wrist mass to Pathology.  TOURNIQUET TIME:  23 minutes.  DISPOSITION:  Stable to PACU.  INDICATIONS:  Rachel Graham is a 43 year old female who has had a mass in the dorsum of the right wrist for approximately 1 year.  It is bothersome to her especially when she bumps it.  She wished to have it excised.  Risks, benefits, and alternatives of surgery were discussed including risk of blood loss, infection, damage to nerves, vessels, tendons, ligaments, bone, failure of surgery, need for additional surgery, complications with wound healing, continued pain, and recurrence of mass.  She voiced understanding of these risks and elected to proceed.  OPERATIVE COURSE:  After being identified preoperatively by myself, the patient and I agreed upon procedure and site of procedure.  Surgical site was marked.  The risks, benefits, and alternatives of surgery were reviewed and she wished to proceed.  Surgical consent had been signed. She was given IV Ancef as preoperative antibiotic prophylaxis.  She was transferred to the operating room and placed on the operating room table in supine position with the right upper extremity on arm board.  General anesthesia was induced by the anesthesiologist.  The right upper extremity was  prepped and draped in normal sterile orthopedic fashion. A surgical pause was performed between surgeons, anesthesia, and operating room staff, and all were in agreement as to the patient, procedure, and site of procedure.  Tourniquet at the proximal aspect of the extremity was inflated to 250 mmHg after exsanguination of the limb with an Esmarch bandage.  Incision was made over the mass and dorsum of the wrist in a transverse fashion.  It was carried through subcutaneous tissues by spreading technique.  Bipolar electrocautery was used to obtain hemostasis.  The mass was easily identified.  It was freed of soft tissue attachments.  The stalk was traced down to the radiocarpal joint.  The mass was removed and sent to Pathology for examination.  A 4- 0 Vicryl suture was used to close the rent in the capsule.  The wound was copiously irrigated with sterile saline.  Two inverted interrupted Vicryl sutures were placed in subcutaneous tissues and skin was closed with a 5-0 Monocryl in a running subcuticular fashion.  This was augmented with Steri-Strips.  The area was injected with 5 mL of 0.25% plain Marcaine to aid in postoperative analgesia.  The wound was then dressed with sterile 4x4s and wrapped with a Kerlix bandage.  A volar splint was placed and wrapped with Kerlix and Ace bandage.  Tourniquet was deflated at 23 minutes.  Fingertips were pink with brisk capillary refill after deflation of the tourniquet.  The operative drapes were broken down, and the patient was awoken from anesthesia safely.  She was transferred back to stretcher and taken to PACU in stable condition.  I will see her back in the office in 1 week for postoperative followup.  I will give her Percocet 5/325, 1-2 p.o. q.6 hours p.r.n. pain, dispensed #30.     Leanora Cover, MD     KK/MEDQ  D:  04/27/2014  T:  04/28/2014  Job:  [id number]

## 2014-04-29 ENCOUNTER — Encounter (HOSPITAL_BASED_OUTPATIENT_CLINIC_OR_DEPARTMENT_OTHER): Payer: Self-pay | Admitting: Orthopedic Surgery

## 2015-02-25 ENCOUNTER — Other Ambulatory Visit (INDEPENDENT_AMBULATORY_CARE_PROVIDER_SITE_OTHER): Payer: 59

## 2015-02-25 ENCOUNTER — Ambulatory Visit (INDEPENDENT_AMBULATORY_CARE_PROVIDER_SITE_OTHER): Payer: 59 | Admitting: Internal Medicine

## 2015-02-25 ENCOUNTER — Encounter: Payer: Self-pay | Admitting: Internal Medicine

## 2015-02-25 VITALS — BP 116/76 | HR 69 | Temp 98.1°F | Resp 14 | Ht 63.0 in | Wt 205.8 lb

## 2015-02-25 DIAGNOSIS — M25369 Other instability, unspecified knee: Secondary | ICD-10-CM | POA: Insufficient documentation

## 2015-02-25 DIAGNOSIS — E669 Obesity, unspecified: Secondary | ICD-10-CM

## 2015-02-25 DIAGNOSIS — Z0000 Encounter for general adult medical examination without abnormal findings: Secondary | ICD-10-CM

## 2015-02-25 LAB — CBC
HCT: 34.4 % — ABNORMAL LOW (ref 36.0–46.0)
Hemoglobin: 11.5 g/dL — ABNORMAL LOW (ref 12.0–15.0)
MCHC: 33.3 g/dL (ref 30.0–36.0)
MCV: 87.3 fl (ref 78.0–100.0)
PLATELETS: 289 10*3/uL (ref 150.0–400.0)
RBC: 3.94 Mil/uL (ref 3.87–5.11)
RDW: 14.2 % (ref 11.5–15.5)
WBC: 5.7 10*3/uL (ref 4.0–10.5)

## 2015-02-25 LAB — LIPID PANEL
CHOL/HDL RATIO: 2
Cholesterol: 156 mg/dL (ref 0–200)
HDL: 63.2 mg/dL (ref >39.00)
LDL Cholesterol: 86 mg/dL (ref 0–99)
NONHDL: 92.8
Triglycerides: 36 mg/dL (ref 0.0–149.0)
VLDL: 7.2 mg/dL (ref 0.0–40.0)

## 2015-02-25 LAB — COMPREHENSIVE METABOLIC PANEL
ALT: 12 U/L (ref 0–35)
AST: 14 U/L (ref 0–37)
Albumin: 4 g/dL (ref 3.5–5.2)
Alkaline Phosphatase: 51 U/L (ref 39–117)
BUN: 13 mg/dL (ref 6–23)
CO2: 28 mEq/L (ref 19–32)
Calcium: 9 mg/dL (ref 8.4–10.5)
Chloride: 107 mEq/L (ref 96–112)
Creatinine, Ser: 0.9 mg/dL (ref 0.40–1.20)
GFR: 87.79 mL/min (ref >60.00)
Glucose, Bld: 91 mg/dL (ref 70–99)
Potassium: 4 mEq/L (ref 3.5–5.1)
SODIUM: 138 meq/L (ref 135–145)
TOTAL PROTEIN: 6.6 g/dL (ref 6.0–8.3)
Total Bilirubin: 0.4 mg/dL (ref 0.2–1.2)

## 2015-02-25 LAB — HEMOGLOBIN A1C: HEMOGLOBIN A1C: 5.6 % (ref 4.6–6.5)

## 2015-02-25 LAB — TSH: TSH: 0.74 u[IU]/mL (ref 0.35–4.50)

## 2015-02-25 NOTE — Assessment & Plan Note (Signed)
Both knees with problems lately. No ACL or PCL tears on exam. Talked to her about the fact that her weight is likely making this worse. Will refer to sports medicine for evaluation.

## 2015-02-25 NOTE — Progress Notes (Signed)
Pre visit review using our clinic review tool, if applicable. No additional management support is needed unless otherwise documented below in the visit note. 

## 2015-02-25 NOTE — Assessment & Plan Note (Signed)
Up to date on pap smear, tetanus, flu, mammogram (does yearly), declines HIV testing today.

## 2015-02-25 NOTE — Progress Notes (Signed)
   Subjective:    Patient ID: Rachel Graham, female    DOB: Jan 22, 1971, 44 y.o.   MRN: [id number]  HPI The patient is a 44 YO female who is coming in for knee instability which has been going on for several years. Both of her knees have been injured in the past and she did not seek care as they got better with time. However in the last 6 months she feels as though her knees are giving out on her more and she loses her balance and has to catch herself. Denies falling or re-injury recently. Does not take medicine for it. Happens 1-2 times per week.   PMH, [facility], social history reviewed and updated with patient today.   Review of Systems  Constitutional: Negative for fever, activity change, appetite change, fatigue and unexpected weight change.  HENT: Negative.   Eyes: Negative.   Respiratory: Negative for cough, chest tightness, shortness of breath and wheezing.   Cardiovascular: Negative for chest pain, palpitations and leg swelling.  Gastrointestinal: Negative for abdominal pain, diarrhea, constipation and abdominal distention.  Musculoskeletal: Positive for arthralgias and gait problem.  Skin: Negative.   Neurological: Negative.   Psychiatric/Behavioral: Negative.       Objective:   Physical Exam  Constitutional: She is oriented to person, place, and time. She appears well-developed and well-nourished.  Overweight  HENT:  Head: Normocephalic and atraumatic.  Eyes: EOM are normal.  Neck: Normal range of motion.  Cardiovascular: Normal rate and regular rhythm.   Pulmonary/Chest: Effort normal and breath sounds normal.  Abdominal: Soft. Bowel sounds are normal. She exhibits no distension. There is no tenderness. There is no rebound.  Musculoskeletal: She exhibits no edema.  Neurological: She is alert and oriented to person, place, and time. Coordination normal.  Skin: Skin is warm and dry.  Psychiatric: She has a normal mood and affect.   Filed Vitals:   02/25/15 1057  BP:  116/76  Pulse: 69  Temp: 98.1 F (36.7 C)  TempSrc: Oral  Resp: 14  Height: 5\' 3"  (1.6 m)  Weight: 205 lb 12.8 oz (93.35 kg)  SpO2: 99%      Assessment & Plan:

## 2015-02-25 NOTE — Patient Instructions (Signed)
We will check the blood work today and call you back with the results.   You are doing a good job with the health so keep up the good work with the exercise.   The best way to keep from getting diabetes is to keep the weight and the eating good. Extra fat makes the body use insulin less well and can cause diabetes over time.   Health Maintenance Adopting a healthy lifestyle and getting preventive care can go a long way to promote health and wellness. Talk with your health care provider about what schedule of regular examinations is right for you. This is a good chance for you to check in with your provider about disease prevention and staying healthy. In between checkups, there are plenty of things you can do on your own. Experts have done a lot of research about which lifestyle changes and preventive measures are most likely to keep you healthy. Ask your health care provider for more information. WEIGHT AND DIET  Eat a healthy diet  Be sure to include plenty of vegetables, fruits, low-fat dairy products, and lean protein.  Do not eat a lot of foods high in solid fats, added sugars, or salt.  Get regular exercise. This is one of the most important things you can do for your health.  Most adults should exercise for at least 150 minutes each week. The exercise should increase your heart rate and make you sweat (moderate-intensity exercise).  Most adults should also do strengthening exercises at least twice a week. This is in addition to the moderate-intensity exercise.  Maintain a healthy weight  Body mass index (BMI) is a measurement that can be used to identify possible weight problems. It estimates body fat based on height and weight. Your health care provider can help determine your BMI and help you achieve or maintain a healthy weight.  For females 22 years of age and older:   A BMI below 18.5 is considered underweight.  A BMI of 18.5 to 24.9 is normal.  A BMI of 25 to 29.9 is  considered overweight.  A BMI of 30 and above is considered obese.  Watch levels of cholesterol and blood lipids  You should start having your blood tested for lipids and cholesterol at 43 years of age, then have this test every 5 years.  You may need to have your cholesterol levels checked more often if:  Your lipid or cholesterol levels are high.  You are older than 43 years of age.  You are at high risk for heart disease.  CANCER SCREENING   Lung Cancer  Lung cancer screening is recommended for adults 49-4 years old who are at high risk for lung cancer because of a history of smoking.  A yearly low-dose CT scan of the lungs is recommended for people who:  Currently smoke.  Have quit within the past 15 years.  Have at least a 30-pack-year history of smoking. A pack year is smoking an average of one pack of cigarettes a day for 1 year.  Yearly screening should continue until it has been 15 years since you quit.  Yearly screening should stop if you develop a health problem that would prevent you from having lung cancer treatment.  Breast Cancer  Practice breast self-awareness. This means understanding how your breasts normally appear and feel.  It also means doing regular breast self-exams. Let your health care provider know about any changes, no matter how small.  If you are in your  66s or 49s, you should have a clinical breast exam (CBE) by a health care provider every 1-3 years as part of a regular health exam.  If you are 54 or older, have a CBE every year. Also consider having a breast X-ray (mammogram) every year.  If you have a family history of breast cancer, talk to your health care provider about genetic screening.  If you are at high risk for breast cancer, talk to your health care provider about having an MRI and a mammogram every year.  Breast cancer gene (BRCA) assessment is recommended for women who have family members with BRCA-related cancers.  BRCA-related cancers include:  Breast.  Ovarian.  Tubal.  Peritoneal cancers.  Results of the assessment will determine the need for genetic counseling and BRCA1 and BRCA2 testing. Cervical Cancer Routine pelvic examinations to screen for cervical cancer are no longer recommended for nonpregnant women who are considered low risk for cancer of the pelvic organs (ovaries, uterus, and vagina) and who do not have symptoms. A pelvic examination may be necessary if you have symptoms including those associated with pelvic infections. Ask your health care provider if a screening pelvic exam is right for you.   The Pap test is the screening test for cervical cancer for women who are considered at risk.  If you had a hysterectomy for a problem that was not cancer or a condition that could lead to cancer, then you no longer need Pap tests.  If you are older than 65 years, and you have had normal Pap tests for the past 10 years, you no longer need to have Pap tests.  If you have had past treatment for cervical cancer or a condition that could lead to cancer, you need Pap tests and screening for cancer for at least 20 years after your treatment.  If you no longer get a Pap test, assess your risk factors if they change (such as having a new sexual partner). This can affect whether you should start being screened again.  Some women have medical problems that increase their chance of getting cervical cancer. If this is the case for you, your health care provider may recommend more frequent screening and Pap tests.  The human papillomavirus (HPV) test is another test that may be used for cervical cancer screening. The HPV test looks for the virus that can cause cell changes in the cervix. The cells collected during the Pap test can be tested for HPV.  The HPV test can be used to screen women 25 years of age and older. Getting tested for HPV can extend the interval between normal Pap tests from three to  five years.  An HPV test also should be used to screen women of any age who have unclear Pap test results.  After 43 years of age, women should have HPV testing as often as Pap tests.  Colorectal Cancer  This type of cancer can be detected and often prevented.  Routine colorectal cancer screening usually begins at 43 years of age and continues through 43 years of age.  Your health care provider may recommend screening at an earlier age if you have risk factors for colon cancer.  Your health care provider may also recommend using home test kits to check for hidden blood in the stool.  A small camera at the end of a tube can be used to examine your colon directly (sigmoidoscopy or colonoscopy). This is done to check for the earliest forms of colorectal  cancer.  Routine screening usually begins at age 46.  Direct examination of the colon should be repeated every 5-10 years through 43 years of age. However, you may need to be screened more often if early forms of precancerous polyps or small growths are found. Skin Cancer  Check your skin from head to toe regularly.  Tell your health care provider about any new moles or changes in moles, especially if there is a change in a mole's shape or color.  Also tell your health care provider if you have a mole that is larger than the size of a pencil eraser.  Always use sunscreen. Apply sunscreen liberally and repeatedly throughout the day.  Protect yourself by wearing long sleeves, pants, a wide-brimmed hat, and sunglasses whenever you are outside. HEART DISEASE, DIABETES, AND HIGH BLOOD PRESSURE   Have your blood pressure checked at least every 1-2 years. High blood pressure causes heart disease and increases the risk of stroke.  If you are between 93 years and 47 years old, ask your health care provider if you should take aspirin to prevent strokes.  Have regular diabetes screenings. This involves taking a blood sample to check your  fasting blood sugar level.  If you are at a normal weight and have a low risk for diabetes, have this test once every three years after 43 years of age.  If you are overweight and have a high risk for diabetes, consider being tested at a younger age or more often. PREVENTING INFECTION  Hepatitis B  If you have a higher risk for hepatitis B, you should be screened for this virus. You are considered at high risk for hepatitis B if:  You were born in a country where hepatitis B is common. Ask your health care provider which countries are considered high risk.  Your parents were born in a high-risk country, and you have not been immunized against hepatitis B (hepatitis B vaccine).  You have HIV or AIDS.  You use needles to inject street drugs.  You live with someone who has hepatitis B.  You have had sex with someone who has hepatitis B.  You get hemodialysis treatment.  You take certain medicines for conditions, including cancer, organ transplantation, and autoimmune conditions. Hepatitis C  Blood testing is recommended for:  Everyone born from [birth year] through 1964.  Anyone with known risk factors for hepatitis C. Sexually transmitted infections (STIs)  You should be screened for sexually transmitted infections (STIs) including gonorrhea and chlamydia if:  You are sexually active and are younger than 43 years of age.  You are older than 43 years of age and your health care provider tells you that you are at risk for this type of infection.  Your sexual activity has changed since you were last screened and you are at an increased risk for chlamydia or gonorrhea. Ask your health care provider if you are at risk.  If you do not have HIV, but are at risk, it may be recommended that you take a prescription medicine daily to prevent HIV infection. This is called pre-exposure prophylaxis (PrEP). You are considered at risk if:  You are sexually active and do not regularly use condoms or  know the HIV status of your partner(s).  You take drugs by injection.  You are sexually active with a partner who has HIV. Talk with your health care provider about whether you are at high risk of being infected with HIV. If you choose to begin PrEP, you  should first be tested for HIV. You should then be tested every 3 months for as long as you are taking PrEP.  PREGNANCY   If you are premenopausal and you may become pregnant, ask your health care provider about preconception counseling.  If you may become pregnant, take 400 to 800 micrograms (mcg) of folic acid every day.  If you want to prevent pregnancy, talk to your health care provider about birth control (contraception). OSTEOPOROSIS AND MENOPAUSE   Osteoporosis is a disease in which the bones lose minerals and strength with aging. This can result in serious bone fractures. Your risk for osteoporosis can be identified using a bone density scan.  If you are 63 years of age or older, or if you are at risk for osteoporosis and fractures, ask your health care provider if you should be screened.  Ask your health care provider whether you should take a calcium or vitamin D supplement to lower your risk for osteoporosis.  Menopause may have certain physical symptoms and risks.  Hormone replacement therapy may reduce some of these symptoms and risks. Talk to your health care provider about whether hormone replacement therapy is right for you.  HOME CARE INSTRUCTIONS   Schedule regular health, dental, and eye exams.  Stay current with your immunizations.   Do not use any tobacco products including cigarettes, chewing tobacco, or electronic cigarettes.  If you are pregnant, do not drink alcohol.  If you are breastfeeding, limit how much and how often you drink alcohol.  Limit alcohol intake to no more than 1 drink per day for nonpregnant women. One drink equals 12 ounces of beer, 5 ounces of wine, or 1 ounces of hard liquor.  Do  not use street drugs.  Do not share needles.  Ask your health care provider for help if you need support or information about quitting drugs.  Tell your health care provider if you often feel depressed.  Tell your health care provider if you have ever been abused or do not feel safe at home. Document Released: 05/28/2011 Document Revised: 03/29/2014 Document Reviewed: 10/14/2013 [facility] Patient Information 2015 [place], [place]. This information is not intended to replace advice given to you by your health care provider. Make sure you discuss any questions you have with your health care provider.

## 2015-02-25 NOTE — Assessment & Plan Note (Signed)
Checking labs today for possible complications. No hypertension on today's exam. She knows that she needs to lose weight and does not exercise right now. Talked to her about serving sizes and regular exercise as ways to start losing some weight. She will try.

## 2015-02-28 ENCOUNTER — Telehealth: Payer: Self-pay

## 2015-02-28 NOTE — Telephone Encounter (Signed)
Advised patient of dr Jeraldine Loots lab results

## 2015-03-09 ENCOUNTER — Ambulatory Visit: Payer: 59 | Admitting: Family Medicine

## 2015-03-22 ENCOUNTER — Ambulatory Visit (INDEPENDENT_AMBULATORY_CARE_PROVIDER_SITE_OTHER): Payer: 59

## 2015-03-22 ENCOUNTER — Ambulatory Visit (INDEPENDENT_AMBULATORY_CARE_PROVIDER_SITE_OTHER): Payer: 59 | Admitting: Internal Medicine

## 2015-03-22 ENCOUNTER — Ambulatory Visit (INDEPENDENT_AMBULATORY_CARE_PROVIDER_SITE_OTHER): Payer: 59 | Admitting: Family Medicine

## 2015-03-22 ENCOUNTER — Encounter: Payer: Self-pay | Admitting: Family Medicine

## 2015-03-22 VITALS — BP 122/78 | HR 80 | Temp 98.5°F | Resp 16 | Ht 63.0 in | Wt 203.0 lb

## 2015-03-22 VITALS — BP 122/78 | HR 80 | Ht 63.0 in | Wt 203.0 lb

## 2015-03-22 DIAGNOSIS — M76899 Other specified enthesopathies of unspecified lower limb, excluding foot: Secondary | ICD-10-CM | POA: Insufficient documentation

## 2015-03-22 DIAGNOSIS — M769 Unspecified enthesopathy, lower limb, excluding foot: Secondary | ICD-10-CM

## 2015-03-22 DIAGNOSIS — M25562 Pain in left knee: Secondary | ICD-10-CM

## 2015-03-22 DIAGNOSIS — J302 Other seasonal allergic rhinitis: Secondary | ICD-10-CM | POA: Diagnosis not present

## 2015-03-22 DIAGNOSIS — M25561 Pain in right knee: Secondary | ICD-10-CM

## 2015-03-22 MED ORDER — MELOXICAM 15 MG PO TABS: 15.0000 mg | ORAL_TABLET | Freq: Every day | ORAL | Status: DC

## 2015-03-22 MED ORDER — FLUTICASONE PROPIONATE 50 MCG/ACT NA SUSP: 2.0000 | Freq: Every day | NASAL | Status: DC

## 2015-03-22 NOTE — Patient Instructions (Addendum)
We have sent in flonase for the allergies which you use 2 puffs in each nostril twice a day (for the first 2 days) then once a day. This helps to dry up the sinuses. You do not need antibiotics today but if you are not better in 2 weeks call the office back.   You can still use over the counter cold medicine for the other symptoms. I would recommend tylenol for the muscle soreness and pain.   Upper Respiratory Infection, Adult An upper respiratory infection (URI) is also sometimes known as the common cold. The upper respiratory tract includes the nose, sinuses, throat, trachea, and bronchi. Bronchi are the airways leading to the lungs. Most people improve within 1 week, but symptoms can last up to 2 weeks. A residual cough may last even longer.  CAUSES Many different viruses can infect the tissues lining the upper respiratory tract. The tissues become irritated and inflamed and often become very moist. Mucus production is also common. A cold is contagious. You can easily spread the virus to others by oral contact. This includes kissing, sharing a glass, coughing, or sneezing. Touching your mouth or nose and then touching a surface, which is then touched by another person, can also spread the virus. SYMPTOMS  Symptoms typically develop 1 to 3 days after you come in contact with a cold virus. Symptoms vary from person to person. They may include:  Runny nose.  Sneezing.  Nasal congestion.  Sinus irritation.  Sore throat.  Loss of voice (laryngitis).  Cough.  Fatigue.  Muscle aches.  Loss of appetite.  Headache.  Low-grade fever. DIAGNOSIS  You might diagnose your own cold based on familiar symptoms, since most people get a cold 2 to 3 times a year. Your caregiver can confirm this based on your exam. Most importantly, your caregiver can check that your symptoms are not due to another disease such as strep throat, sinusitis, pneumonia, asthma, or epiglottitis. Blood tests, throat  tests, and X-rays are not necessary to diagnose a common cold, but they may sometimes be helpful in excluding other more serious diseases. Your caregiver will decide if any further tests are required. RISKS AND COMPLICATIONS  You may be at risk for a more severe case of the common cold if you smoke cigarettes, have chronic heart disease (such as heart failure) or lung disease (such as asthma), or if you have a weakened immune system. The very young and very old are also at risk for more serious infections. Bacterial sinusitis, middle ear infections, and bacterial pneumonia can complicate the common cold. The common cold can worsen asthma and chronic obstructive pulmonary disease (COPD). Sometimes, these complications can require emergency medical care and may be life-threatening. PREVENTION  The best way to protect against getting a cold is to practice good hygiene. Avoid oral or hand contact with people with cold symptoms. Wash your hands often if contact occurs. There is no clear evidence that vitamin C, vitamin E, echinacea, or exercise reduces the chance of developing a cold. However, it is always recommended to get plenty of rest and practice good nutrition. TREATMENT  Treatment is directed at relieving symptoms. There is no cure. Antibiotics are not effective, because the infection is caused by a virus, not by bacteria. Treatment may include:  Increased fluid intake. Sports drinks offer valuable electrolytes, sugars, and fluids.  Breathing heated mist or steam (vaporizer or shower).  Eating chicken soup or other clear broths, and maintaining good nutrition.  Getting plenty of  rest.  Using gargles or lozenges for comfort.  Controlling fevers with ibuprofen or acetaminophen as directed by your caregiver.  Increasing usage of your inhaler if you have asthma. Zinc gel and zinc lozenges, taken in the first 24 hours of the common cold, can shorten the duration and lessen the severity of  symptoms. Pain medicines may help with fever, muscle aches, and throat pain. A variety of non-prescription medicines are available to treat congestion and runny nose. Your caregiver can make recommendations and may suggest nasal or lung inhalers for other symptoms.  HOME CARE INSTRUCTIONS   Only take over-the-counter or prescription medicines for pain, discomfort, or fever as directed by your caregiver.  Use a warm mist humidifier or inhale steam from a shower to increase air moisture. This may keep secretions moist and make it easier to breathe.  Drink enough water and fluids to keep your urine clear or pale yellow.  Rest as needed.  Return to work when your temperature has returned to normal or as your caregiver advises. You may need to stay home longer to avoid infecting others. You can also use a face mask and careful hand washing to prevent spread of the virus. SEEK MEDICAL CARE IF:   After the first few days, you feel you are getting worse rather than better.  You need your caregiver's advice about medicines to control symptoms.  You develop chills, worsening shortness of breath, or brown or red sputum. These may be signs of pneumonia.  You develop yellow or brown nasal discharge or pain in the face, especially when you bend forward. These may be signs of sinusitis.  You develop a fever, swollen neck glands, pain with swallowing, or white areas in the back of your throat. These may be signs of strep throat. SEEK IMMEDIATE MEDICAL CARE IF:   You have a fever.  You develop severe or persistent headache, ear pain, sinus pain, or chest pain.  You develop wheezing, a prolonged cough, cough up blood, or have a change in your usual mucus (if you have chronic lung disease).  You develop sore muscles or a stiff neck. Document Released: 05/08/2001 Document Revised: 02/04/2012 Document Reviewed: 02/17/2014 [facility] Patient Information 2015 [place], [place]. This information is not intended  to replace advice given to you by your health care provider. Make sure you discuss any questions you have with your health care provider.

## 2015-03-22 NOTE — Progress Notes (Signed)
Pre visit review using our clinic review tool, if applicable. No additional management support is needed unless otherwise documented below in the visit note. 

## 2015-03-22 NOTE — Progress Notes (Signed)
[name] [address], [place] [address] Phone: [phone] Subjective:    I'm seeing this patient by the request  of:  Olga Millers, MD   CC: Bilateral knee pain with instability.  [id number] Rachel Graham is a 44 y.o. female coming in with complaint of bilateral knee pain. Patient has had this pain for quite some time and did talk to her primary care physician who referred her here for further evaluation. Patient states she's had this pain for years but it seems to be worse. Patient is attempting to try to lose weight on a regular basis and doing significantly more activity. Patient states though unfortunately the more activity she does more her knees hurt. Patient states actually sometimes it feels to be some instability as well. Patient denies any radiation the pain but states it is more of a dull throbbing aching sensation. Patient can still do daily activities and states that she is sleeping comfortably at night. Patient rates the severity of pain though is 6 out of 10. Patient tries to avoid any medications when possible.     Past Medical History  Diagnosis Date  . Medical history non-contributory   . Contact lens/glasses fitting     wears glases or contacts  . Blood in stool    Past Surgical History  Procedure Laterality Date  . Fibroids surgery  2008    d/c hysteroscopy  . Diagnostic laparoscopy  2010    lt ovarian cyst-LOA  . Ganglion cyst excision Right 04/27/2014    Procedure: RIGHT WRIST EXCISION OF MASS ;  Surgeon: Tennis Must, MD;  Location: [facility];  Service: Orthopedics;  Laterality: Right;   History  Substance Use Topics  . Smoking status: Never Smoker   . Smokeless tobacco: Not on file  . Alcohol Use: Yes     Comment: occasional   No Known Allergies Family History  Problem Relation Age of Onset  . Diabetes Mother   . Diabetes Father   . Diabetes Sister   . Diabetes Brother       Past medical history, social, surgical and family history all reviewed in electronic medical record.   Review of Systems: No headache, visual changes, nausea, vomiting, diarrhea, constipation, dizziness, abdominal pain, skin rash, fevers, chills, night sweats, weight loss, swollen lymph nodes, body aches, joint swelling, muscle aches, chest pain, shortness of breath, mood changes.   Objective Blood pressure 122/78, pulse 80, height 5\' 3"  (1.6 m), weight 203 lb (92.08 kg), last menstrual period 02/08/2015, SpO2 99 %.  General: No apparent distress alert and oriented x3 mood and affect normal, dressed appropriately.  HEENT: Pupils equal, extraocular movements intact  Respiratory: Patient's speak in full sentences and does not appear short of breath  Cardiovascular: No lower extremity edema, non tender, no erythema  Skin: Warm dry intact with no signs of infection or rash on extremities or on axial skeleton.  Abdomen: Soft nontender  Neuro: Cranial nerves II through XII are intact, neurovascularly intact in all extremities with 2+ DTRs and 2+ pulses.  Lymph: No lymphadenopathy of posterior or anterior cervical chain or axillae bilaterally.  Gait normal with good balance and coordination.  MSK:  Non tender with full range of motion and good stability and symmetric strength and tone of shoulders, elbows, wrist, hip, and ankles bilaterally.  Knee: Bilateral Normal to inspection with no erythema or effusion or obvious bony abnormalities. Mild tenderness over the patella superiorly. ROM  full in flexion and extension and lower leg rotation. Ligaments with solid consistent endpoints including ACL, PCL, LCL, MCL. Negative Mcmurray's, Apley's, and Thessalonian tests. Minimal painful patellar compression. Patellar glide with mild crepitus. Patellar and quadriceps tendons unremarkable. Hamstring and quadriceps strength is normal.   MSK US performed of: Bilateral knees This study was ordered,  performed, and interpreted by Charlann Boxer D.O.  Knee: All structures visualized. Anteromedial, anterolateral, posteromedial, and posterolateral menisci unremarkable without tearing, fraying, effusion, or displacement. Patellar Tendon unremarkable on long and transverse views without effusion. Patient so quadricep tendon does have calcific changes noted. Mild increase in Doppler flow noted as well. No abnormality of prepatellar bursa. LCL and MCL unremarkable on long and transverse views. No abnormality of origin of medial or lateral head of the gastrocnemius.  IMPRESSION:  Calcific changes of the quadriceps bilaterally   Procedure note 78242; 15 minutes spent for Therapeutic exercises as stated in above notes.  This included exercises focusing on stretching, strengthening, with significant focus on eccentric aspects. Patient given exercises for the quadriceps and vastus medialis oblique, hip abductor strengthening as well as proper stretching techniques. Proper technique shown and discussed handout in great detail with ATC.  All questions were discussed and answered.     Impression and Recommendations:     This case required medical decision making of moderate complexity.

## 2015-03-22 NOTE — Patient Instructions (Addendum)
Good to see you.  Ice 20 minutes 2 times daily. Usually after activity and before bed. Exercises 3 times a week.  Keep being active Meloxicam daily for 10 days then as needed Vitamin D 2000 IU daily Turmeric 500mg  twice daily.  See me again in 3 week

## 2015-03-22 NOTE — Assessment & Plan Note (Signed)
Patient does have more of a quadriceps tendinitis bilaterally. Patient does not have any risk factors for hypercalcemia at this point but is interesting the patient has so much calcific changes within the tendon itself. Patient otherwise has some mild patellofemoral syndrome that could also be contributing and may be in the differential. We discussed home exercises, icing protocol, and patient will do a short course of oral anti-inflammatories. Patient will try some over-the-counter natural supplementations as well. Patient and will come back and see me again in 3 weeks. If continuing to have pain we may need to rule out hypercalcemia, vitamin D deficiency, as well as autoimmune labs and uric acid that could be contributing to calcific changes of the tendon.

## 2015-03-24 DIAGNOSIS — J309 Allergic rhinitis, unspecified: Secondary | ICD-10-CM | POA: Insufficient documentation

## 2015-03-24 NOTE — Progress Notes (Signed)
   Subjective:    Patient ID: Rachel Graham, female    DOB: 06/26/1971, 43 y.o.   MRN: [id number]  HPI The patient is a 44 YO female who is coming in for sinus fullness for 1 day. She has not tried anything for it. Denies if it is getting worse. Denies headache, fevers, chills, cough. Mild sore throat. Has been outside recently but denies typically getting bad allergies. Denies sick contacts.   Review of Systems  Constitutional: Negative for fever, activity change, appetite change, fatigue and unexpected weight change.  HENT: Positive for congestion, postnasal drip, rhinorrhea and sore throat. Negative for dental problem, ear pain, sinus pressure and trouble swallowing.   Respiratory: Negative.   Cardiovascular: Negative.       Objective:   Physical Exam  Constitutional: She appears well-developed and well-nourished.  HENT:  Head: Normocephalic and atraumatic.  Right Ear: External ear normal.  Left Ear: External ear normal.  Mild erythema in the turbinates and minimal clear drainage in the oropharynx.   Eyes: EOM are normal.  Neck: Normal range of motion. No thyromegaly present.  Cardiovascular: Normal rate and regular rhythm.   Pulmonary/Chest: Effort normal and breath sounds normal.  Abdominal: Soft.  Lymphadenopathy:    She has no cervical adenopathy.   Filed Vitals:   03/22/15 1509  BP: 122/78  Pulse: 80  Temp: 98.5 F (36.9 C)  TempSrc: Oral  Resp: 16  Height: 5\' 3"  (1.6 m)  Weight: 203 lb (92.08 kg)  SpO2: 99%      Assessment & Plan:

## 2015-03-24 NOTE — Assessment & Plan Note (Signed)
Rx for flonase. No indication for antibiotics. Informed in not improved in 2 weeks or significant worsening to call office back.

## 2015-04-18 ENCOUNTER — Encounter: Payer: Self-pay | Admitting: Family Medicine

## 2015-04-18 ENCOUNTER — Ambulatory Visit (INDEPENDENT_AMBULATORY_CARE_PROVIDER_SITE_OTHER): Payer: 59 | Admitting: Family Medicine

## 2015-04-18 VITALS — BP 130/76 | HR 63 | Ht 63.0 in | Wt 205.0 lb

## 2015-04-18 DIAGNOSIS — M76899 Other specified enthesopathies of unspecified lower limb, excluding foot: Secondary | ICD-10-CM

## 2015-04-18 DIAGNOSIS — M769 Unspecified enthesopathy, lower limb, excluding foot: Secondary | ICD-10-CM

## 2015-04-18 MED ORDER — MELOXICAM 15 MG PO TABS: 15.0000 mg | ORAL_TABLET | Freq: Every day | ORAL | Status: DC

## 2015-04-18 NOTE — Patient Instructions (Signed)
Good to see you Ice is still your friend Vitamin D forever Continue exercises 2-3 times a week You are doing great Try the pennsaid and if you like it call me and I will get you some Meloxicam when needed See me when needed

## 2015-04-18 NOTE — Progress Notes (Signed)
Pre visit review using our clinic review tool, if applicable. No additional management support is needed unless otherwise documented below in the visit note. 

## 2015-04-18 NOTE — Progress Notes (Signed)
[name] [address], [place] [address] Phone: [phone] Subjective:    CC: Bilateral knee pain with instability.follow up  [id number] Rachel Graham is a 44 y.o. female coming in with complaint of bilateral knee pain. Patient was seen previously and was diagnosed with calcific quadriceps tendinitis. Patient was given home exercises, we discussed over-the-counter natural supplementations, we discussed topical anti-inflammatories. Patient states she is doing significant better. Patient states only at the end of a long day that she have any discomfort. Denies any popping or giving out on her. Overall it states that she's 85% better. Patient has been doing the exercises. Patient states that the medication is also very helpful which is meloxicam.    Past Medical History  Diagnosis Date  . Medical history non-contributory   . Contact lens/glasses fitting     wears glases or contacts  . Blood in stool    Past Surgical History  Procedure Laterality Date  . Fibroids surgery  2008    d/c hysteroscopy  . Diagnostic laparoscopy  2010    lt ovarian cyst-LOA  . Ganglion cyst excision Right 04/27/2014    Procedure: RIGHT WRIST EXCISION OF MASS ;  Surgeon: Tennis Must, MD;  Location: [facility];  Service: Orthopedics;  Laterality: Right;   History  Substance Use Topics  . Smoking status: Never Smoker   . Smokeless tobacco: Not on file  . Alcohol Use: Yes     Comment: occasional   No Known Allergies Family History  Problem Relation Age of Onset  . Diabetes Mother   . Diabetes Father   . Diabetes Sister   . Diabetes Brother      Past medical history, social, surgical and family history all reviewed in electronic medical record.   Review of Systems: No headache, visual changes, nausea, vomiting, diarrhea, constipation, dizziness, abdominal pain, skin rash, fevers, chills, night sweats, weight loss, swollen lymph nodes,  body aches, joint swelling, muscle aches, chest pain, shortness of breath, mood changes.   Objective Blood pressure 130/76, pulse 63, height 5\' 3"  (1.6 m), weight 205 lb (92.987 kg), SpO2 99 %.  General: No apparent distress alert and oriented x3 mood and affect normal, dressed appropriately.  HEENT: Pupils equal, extraocular movements intact  Respiratory: Patient's speak in full sentences and does not appear short of breath  Cardiovascular: No lower extremity edema, non tender, no erythema  Skin: Warm dry intact with no signs of infection or rash on extremities or on axial skeleton.  Abdomen: Soft nontender  Neuro: Cranial nerves II through XII are intact, neurovascularly intact in all extremities with 2+ DTRs and 2+ pulses.  Lymph: No lymphadenopathy of posterior or anterior cervical chain or axillae bilaterally.  Gait normal with good balance and coordination.  MSK:  Non tender with full range of motion and good stability and symmetric strength and tone of shoulders, elbows, wrist, hip, and ankles bilaterally.  Knee: Bilateral Normal to inspection with no erythema or effusion or obvious bony abnormalities. Less tender over the patella ROM full in flexion and extension and lower leg rotation. Ligaments with solid consistent endpoints including ACL, PCL, LCL, MCL. Negative Mcmurray's, Apley's, and Thessalonian tests. Minimal painful patellar compression. Patellar glide with mild crepitus. Patellar and quadriceps tendons unremarkable. Hamstring and quadriceps strength is normal.   MSK US performed of: Bilateral knees This study was ordered, performed, and interpreted by Charlann Boxer D.O.  Knee: All structures visualized. Anteromedial, anterolateral, posteromedial, and  posterolateral menisci unremarkable without tearing, fraying, effusion, or displacement. Patellar Tendon unremarkable on long and transverse views without effusion. Calcific deposits are nonexistent today. No abnormality  of prepatellar bursa. LCL and MCL unremarkable on long and transverse views. No abnormality of origin of medial or lateral head of the gastrocnemius.  IMPRESSION:  Significant improvement in quadriceps tendinitis with no calcific changes.      Impression and Recommendations:     This case required medical decision making of moderate complexity.

## 2015-04-18 NOTE — Assessment & Plan Note (Signed)
Patient overall is doing relatively better. We discussed continuing the icing and home exercises. Patient given topical anti-inflammatories to try. Patient has not pain-free in 4 weeks' patient come back and see me again.

## 2015-05-31 ENCOUNTER — Other Ambulatory Visit (INDEPENDENT_AMBULATORY_CARE_PROVIDER_SITE_OTHER): Payer: 59

## 2015-05-31 ENCOUNTER — Encounter (INDEPENDENT_AMBULATORY_CARE_PROVIDER_SITE_OTHER): Payer: Self-pay

## 2015-05-31 ENCOUNTER — Ambulatory Visit (INDEPENDENT_AMBULATORY_CARE_PROVIDER_SITE_OTHER)
Admission: RE | Admit: 2015-05-31 | Discharge: 2015-05-31 | Disposition: A | Discharge status: Home / Self Care | Payer: 59 | Source: Ambulatory Visit | Attending: Family Medicine | Admitting: Family Medicine

## 2015-05-31 ENCOUNTER — Other Ambulatory Visit: Payer: Self-pay | Admitting: *Deleted

## 2015-05-31 ENCOUNTER — Ambulatory Visit (INDEPENDENT_AMBULATORY_CARE_PROVIDER_SITE_OTHER): Payer: 59 | Admitting: Family Medicine

## 2015-05-31 ENCOUNTER — Encounter: Payer: Self-pay | Admitting: Family Medicine

## 2015-05-31 VITALS — BP 124/78 | HR 61 | Ht 63.0 in | Wt 207.0 lb

## 2015-05-31 DIAGNOSIS — M25369 Other instability, unspecified knee: Secondary | ICD-10-CM

## 2015-05-31 DIAGNOSIS — M25562 Pain in left knee: Secondary | ICD-10-CM

## 2015-05-31 DIAGNOSIS — M79662 Pain in left lower leg: Secondary | ICD-10-CM

## 2015-05-31 DIAGNOSIS — D169 Benign neoplasm of bone and articular cartilage, unspecified: Secondary | ICD-10-CM | POA: Insufficient documentation

## 2015-05-31 MED ORDER — MELOXICAM 15 MG PO TABS: 15.0000 mg | ORAL_TABLET | Freq: Every day | ORAL | Status: DC

## 2015-05-31 NOTE — Assessment & Plan Note (Signed)
I do believe that this is more like a distal tendinitis. Patient though does have some mild positive internal derangement type symptoms today on exam. With patient also having the sclerotic finding of the fibula I do think it is warranted to do more of an advanced imaging. We will do an MRI of the knee as well as the tibia-fibula for further evaluation. Patient denies any fever, chills, or any abnormal weight loss. We discussed icing regimen and home exercises. Patient given a anti-inflammatory. Patient is going come back and see me again in 2-3 weeks for further evaluation and treatment.  Spent  25 minutes with patient face-to-face and had greater than 50% of counseling including as described above in assessment and plan.

## 2015-05-31 NOTE — Progress Notes (Signed)
[facility] [place],  [address] Phone: [phone] Subjective:    CC: Bilateral knee pain with instability.follow up  PXT:GGYIRSWNIO Rachel Graham is a 44 y.o. female coming in with complaint of bilateral knee pain. Patient was seen previously and was diagnosed with calcific quadriceps tendinitis. Patient was given home exercises, we discussed over-the-counter natural supplementations, we discussed topical anti-inflammatories. Patient states that now her pain seems to be more on the lateral aspect of the knee. States that there is some instability of the knee. States that certain activities seem to make it worse. Patient feels that sometimes the knee can almost feel like it is getting stuck. States that she hasn't fallen but feels like she is uneasy on her feet sometimes. States that the anterior aspect of the knee pain has almost completely resolved.    Past Medical History  Diagnosis Date  . Medical history non-contributory   . Contact lens/glasses fitting     wears glases or contacts  . Blood in stool    Past Surgical History  Procedure Laterality Date  . Fibroids surgery  2008    d/c hysteroscopy  . Diagnostic laparoscopy  2010    lt ovarian cyst-LOA  . Ganglion cyst excision Right 04/27/2014    Procedure: RIGHT WRIST EXCISION OF MASS ;  Surgeon: Tennis Must, MD;  Location: [facility];  Service: Orthopedics;  Laterality: Right;   History  Substance Use Topics  . Smoking status: Never Smoker   . Smokeless tobacco: Not on file  . Alcohol Use: Yes     Comment: occasional   No Known Allergies Family History  Problem Relation Age of Onset  . Diabetes Mother   . Diabetes Father   . Diabetes Sister   . Diabetes Brother      Past medical history, social, surgical and family history all reviewed in electronic medical record.   Review of Systems: No headache, visual changes, nausea, vomiting, diarrhea,  constipation, dizziness, abdominal pain, skin rash, fevers, chills, night sweats, weight loss, swollen lymph nodes, body aches, joint swelling, muscle aches, chest pain, shortness of breath, mood changes.   Objective Blood pressure 124/78, pulse 61, height 5\' 3"  (1.6 m), weight 207 lb (93.895 kg), last menstrual period 04/26/2015, SpO2 99 %.  General: No apparent distress alert and oriented x3 mood and affect normal, dressed appropriately.  HEENT: Pupils equal, extraocular movements intact  Respiratory: Patient's speak in full sentences and does not appear short of breath  Cardiovascular: No lower extremity edema, non tender, no erythema  Skin: Warm dry intact with no signs of infection or rash on extremities or on axial skeleton.  Abdomen: Soft nontender  Neuro: Cranial nerves II through XII are intact, neurovascularly intact in all extremities with 2+ DTRs and 2+ pulses.  Lymph: No lymphadenopathy of posterior or anterior cervical chain or axillae bilaterally.  Gait normal with good balance and coordination.  MSK:  Non tender with full range of motion and good stability and symmetric strength and tone of shoulders, elbows, wrist, hip, and ankles bilaterally.  Knee: Left Normal to inspection with no erythema or effusion or obvious bony abnormalities. ROM full in flexion and extension and lower leg rotation. Ligaments with solid consistent endpoints including ACL, PCL, LCL, MCL. Mild positive Mcmurray's, Apley's, and Thessalonian tests. Minimal painful patellar compression. Patellar glide with mild crepitus. Patellar and quadriceps tendons unremarkable.  Tightness of the hamstrings bilaterally left greater than right  MSK  US performed of: Left This study was ordered, performed, and interpreted by Charlann Boxer D.O.  Knee: All structures visualized. Anteromedial, anterolateral, posteromedial, and posterolateral menisci unremarkable without tearing, fraying, effusion, or  displacement. Patellar Tendon unremarkable on long and transverse views without effusion. LCL and MCL unremarkable on long and transverse views. No abnormality of origin of medial or lateral head of the gastrocnemius. Mild hypoechoic changes at the insertion of the lateral hamstring.  IMPRESSION:  Significant improvement in quadriceps tendinitis with no calcific changes but   X-rays were ordered reviewed and interpreted by me today. Bilateral knee x-rays show that patient does have a sclerotic change of the distal fibula. No history of previous injury.    Impression and Recommendations:     This case required medical decision making of moderate complexity.

## 2015-05-31 NOTE — Progress Notes (Signed)
Pre visit review using our clinic review tool, if applicable. No additional management support is needed unless otherwise documented below in the visit note. 

## 2015-05-31 NOTE — Patient Instructions (Signed)
Good to see you today Ice the area, twice a day, after activity and before bedtime Do the exercises 3x/week Thigh compression sleeve - [employer] Meloxicam daily for 10days - called into pharmacy Xrays today downstairs  Continue the pennsaid as needed See me again in 2-3 weeks.

## 2015-06-09 ENCOUNTER — Telehealth: Payer: Self-pay | Admitting: Family Medicine

## 2015-06-09 NOTE — Telephone Encounter (Signed)
Error

## 2015-06-14 ENCOUNTER — Other Ambulatory Visit: Payer: 59

## 2015-06-16 ENCOUNTER — Ambulatory Visit (HOSPITAL_COMMUNITY)
Admission: RE | Admit: 2015-06-16 | Discharge: 2015-06-16 | Disposition: A | Discharge status: Home / Self Care | Payer: 59 | Source: Ambulatory Visit | Attending: Family Medicine | Admitting: Family Medicine

## 2015-06-16 DIAGNOSIS — M9428 Chondromalacia, other site: Secondary | ICD-10-CM | POA: Diagnosis not present

## 2015-06-16 DIAGNOSIS — M25562 Pain in left knee: Secondary | ICD-10-CM

## 2015-06-21 ENCOUNTER — Ambulatory Visit: Payer: 59 | Admitting: Family Medicine

## 2016-03-02 ENCOUNTER — Encounter: Payer: 59 | Admitting: Internal Medicine

## 2016-03-09 IMAGING — MR MR KNEE*L* W/O CM
4 of 6 series · 19 of 40 positions shown · non-contrast
Comparison: Radiographs dated 05/31/2015

CLINICAL DATA: PERSISTENT LEFT KNEE PAIN FOR 3 MONTHS. SCLEROTIC
LESION OF THE LEFT FIBULA.

EXAM:
MRI OF LOWER LEFT LEG WITHOUT CONTRAST; MRI OF THE LEFT KNEE WITHOUT
CONTRAST
TECHNIQUE: Multiplanar, multisequence MR imaging of the knee was performed. No
intravenous contrast was administered.
Multiplanar, multisequence MR imaging of the left lower leg and left
knee was performed. No intravenous contrast was administered.

[Series 2: PD fat-sat · axial · 4.0mm · 0.29mm/px · z∈[-63,+57]mm · 8 of 25 slices shown (1 of 4)]
[im 1/25]
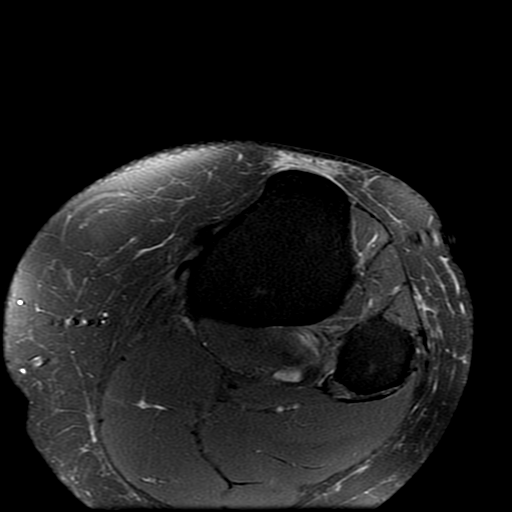
[im 4/25]
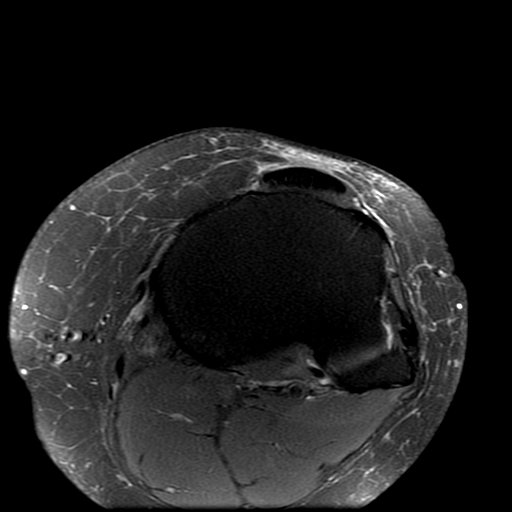
[im 7/25]
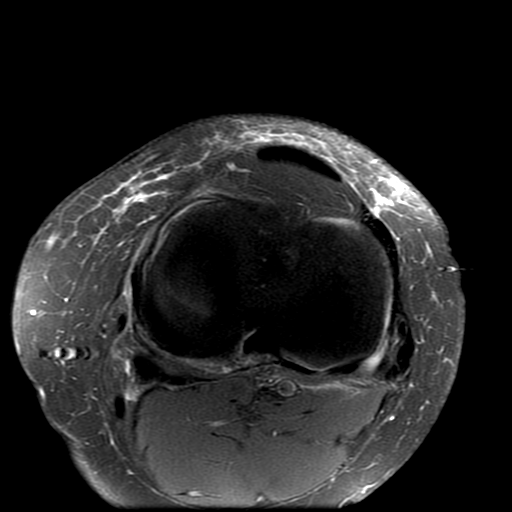
[im 11/25]
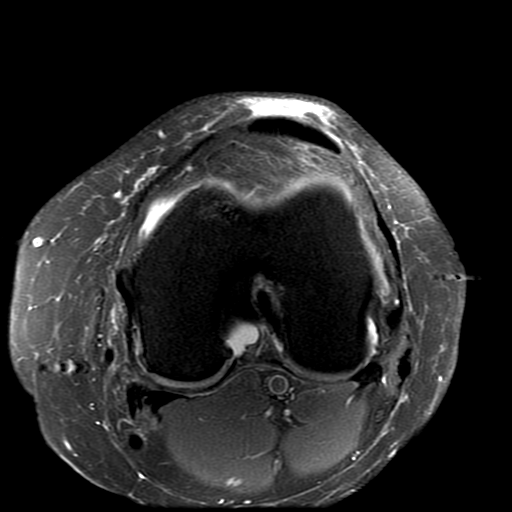
[im 14/25]
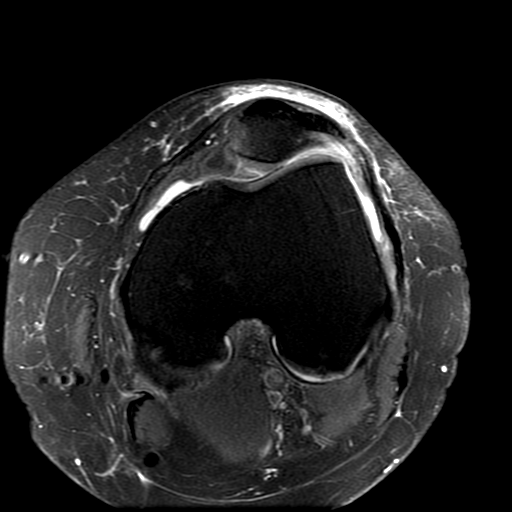
[im 18/25]
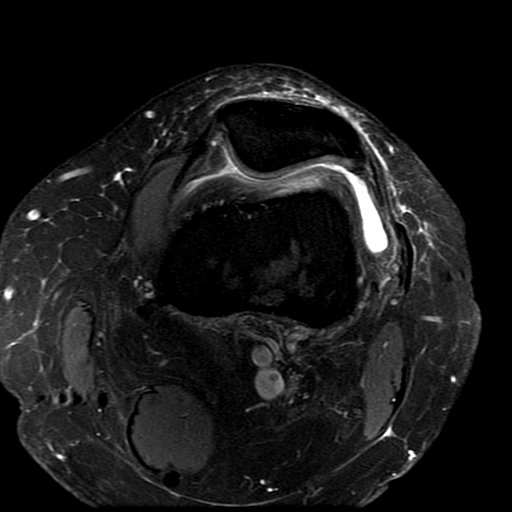
[im 21/25]
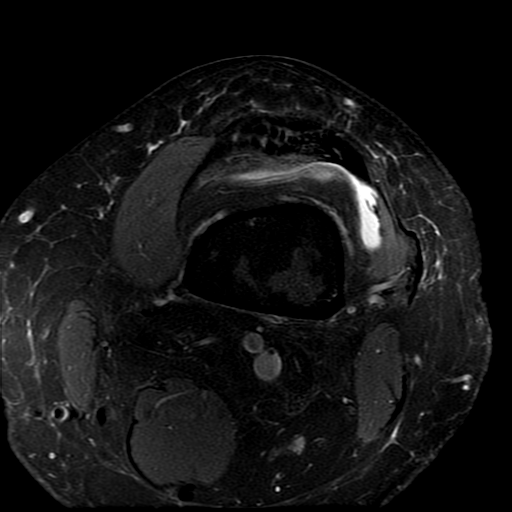
[im 25/25]
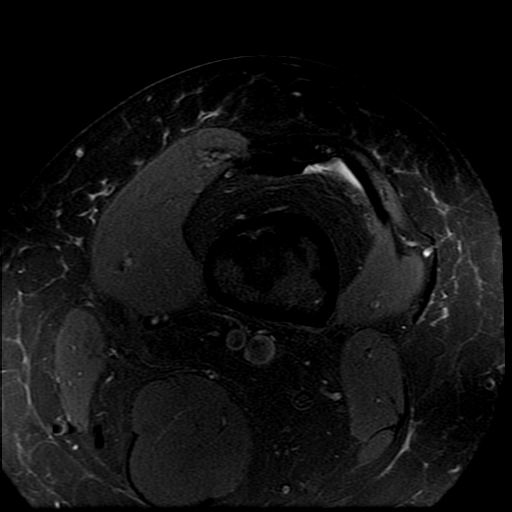

[Series 3: PD fat-sat · coronal · 4.0mm · 0.31mm/px · 5 of 20 slices shown (2 of 4)]
[im 1/20]
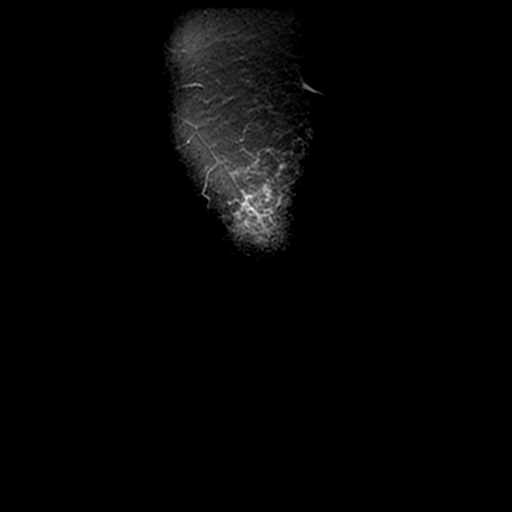
[im 4/20]
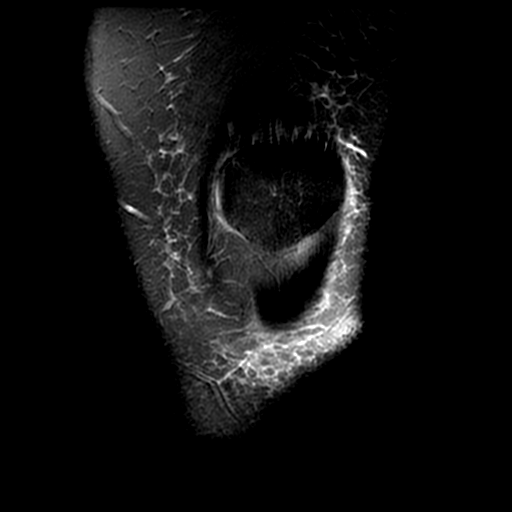
[im 7/20]
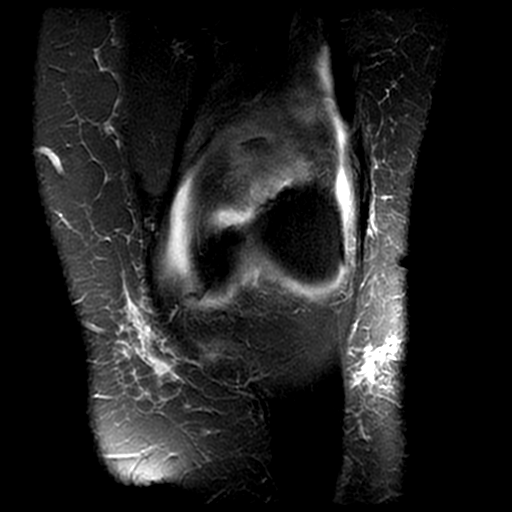
[im 10/20]
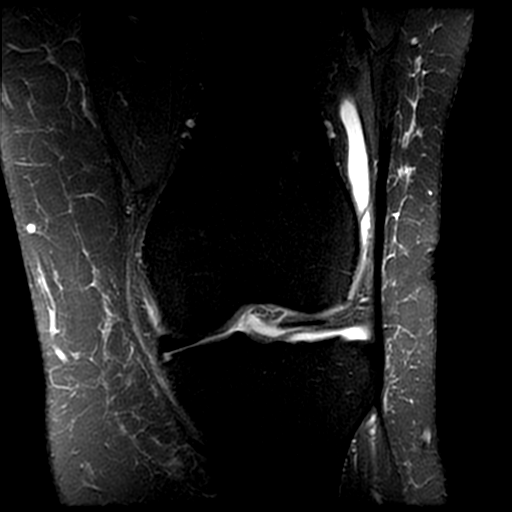
[im 16/20]
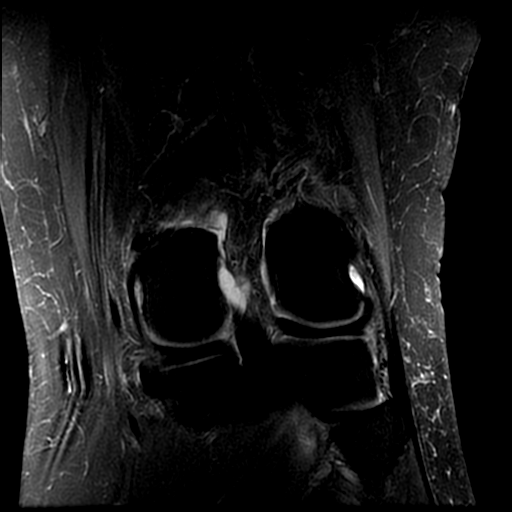

[Series 5: PD fat-sat · sagittal · 4.0mm · 0.31mm/px · 3 of 22 slices shown (3 of 4)]
[im 4/22]
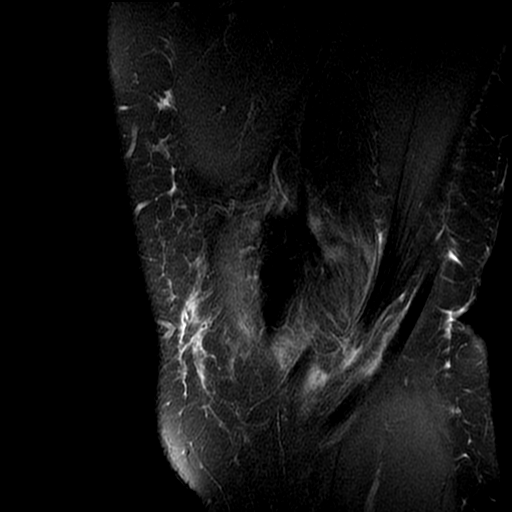
[im 11/22]
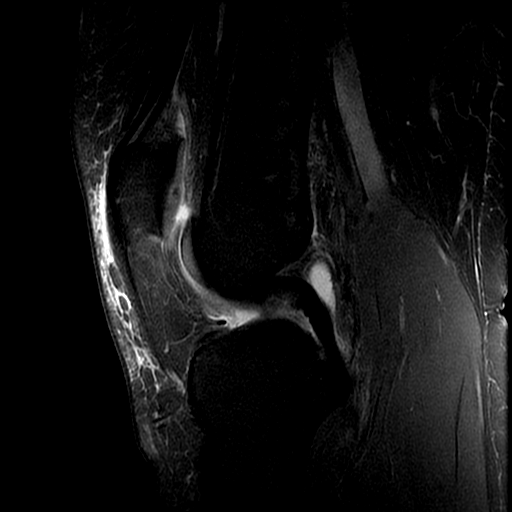
[im 18/22]
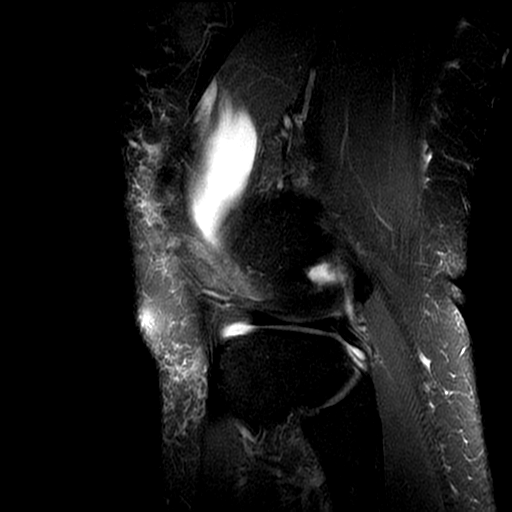

[Series 7: PD fat-sat · coronal · 2.0mm · 0.31mm/px · 3 of 12 slices shown (4 of 4)]
[im 1/12]
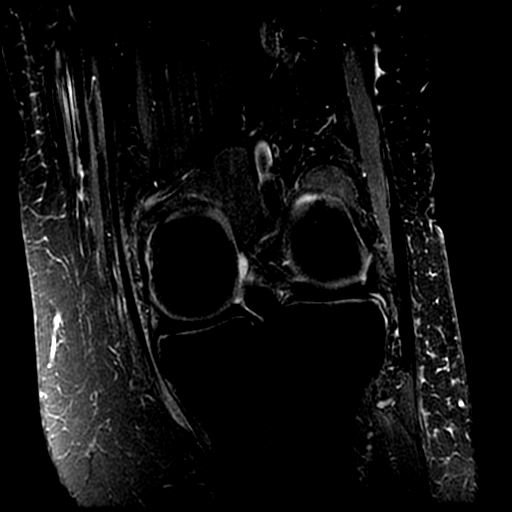
[im 8/12]
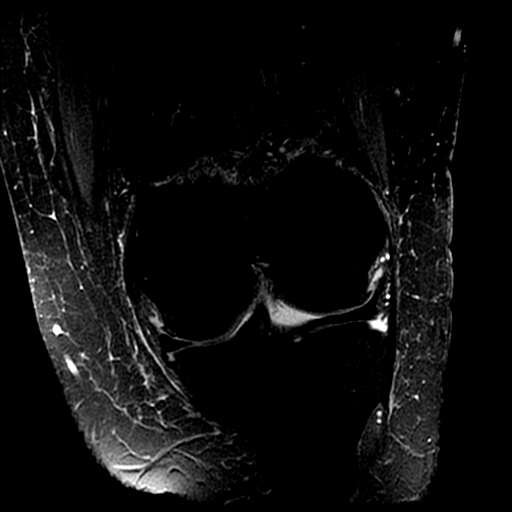
[im 12/12]
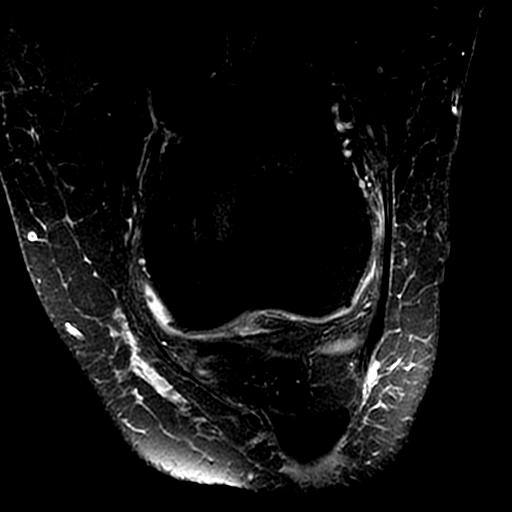

[19 of 40 positions shown; findings below may reference images not displayed]

FINDINGS: MRI OF THE LEFT KNEE:

MENISCI

Medial meniscus:  Normal.

Lateral meniscus:  Normal.

LIGAMENTS

Cruciates:  Normal.

Collaterals:  Normal.

CARTILAGE

Patellofemoral: Small focal areas of grade 4 chondromalacia of the
medial aspect of the trochlear groove and of the lateral facet of
the patella.

Medial:  Normal.

Lateral:  Normal.

Joint:  Small joint effusion.

Popliteal Fossa:  Normal.

Extensor Mechanism:  Normal.

Bones:  Normal.

MRI OF THE LEFT LOWER LEG:

There is a 4.5 x 1.0 x 1.0 cm densely calcified smoothly marginated
exophytic mass arising from the posterior medial aspect of mid
fibular shaft. There is no visible soft tissue component to the mass
and there is no extension into the underlying medullary cavity.
There is no with edema in the adjacent soft tissues. No visible
cartilaginous cap. No other abnormalities.
IMPRESSION: 1. Chondromalacia of the patellofemoral compartment of the left
knee. Otherwise, normal MRI of the left knee.
2. Densely calcified cortical exophytic lesion of the mid tibia with
no aggressive characteristics. This most likely represents a benign
sessile osteochondroma. I recommend an AP and lateral radiograph of
the left lower leg at this time to use as comparison with a
follow-up radiograph in 3 months to determine ongoing stability.

## 2016-03-14 ENCOUNTER — Ambulatory Visit (INDEPENDENT_AMBULATORY_CARE_PROVIDER_SITE_OTHER): Payer: 59 | Admitting: Internal Medicine

## 2016-03-14 ENCOUNTER — Other Ambulatory Visit (INDEPENDENT_AMBULATORY_CARE_PROVIDER_SITE_OTHER): Payer: 59

## 2016-03-14 ENCOUNTER — Encounter: Payer: Self-pay | Admitting: Internal Medicine

## 2016-03-14 VITALS — BP 142/92 | HR 78 | Temp 98.6°F | Resp 14 | Ht 63.0 in | Wt 204.0 lb

## 2016-03-14 DIAGNOSIS — Z0000 Encounter for general adult medical examination without abnormal findings: Secondary | ICD-10-CM

## 2016-03-14 DIAGNOSIS — D169 Benign neoplasm of bone and articular cartilage, unspecified: Secondary | ICD-10-CM | POA: Diagnosis not present

## 2016-03-14 DIAGNOSIS — E669 Obesity, unspecified: Secondary | ICD-10-CM

## 2016-03-14 LAB — COMPREHENSIVE METABOLIC PANEL
ALT: 16 U/L (ref 0–35)
AST: 38 U/L — ABNORMAL HIGH (ref 0–37)
Albumin: 4.3 g/dL (ref 3.5–5.2)
Alkaline Phosphatase: 55 U/L (ref 39–117)
BUN: 16 mg/dL (ref 6–23)
CALCIUM: 9.5 mg/dL (ref 8.4–10.5)
CHLORIDE: 106 meq/L (ref 96–112)
CO2: 29 meq/L (ref 19–32)
Creatinine, Ser: 0.99 mg/dL (ref 0.40–1.20)
GFR: 78.27 mL/min (ref >60.00)
GLUCOSE: 96 mg/dL (ref 70–99)
POTASSIUM: 3.9 meq/L (ref 3.5–5.1)
Sodium: 141 mEq/L (ref 135–145)
Total Bilirubin: 0.5 mg/dL (ref 0.2–1.2)
Total Protein: 7.1 g/dL (ref 6.0–8.3)

## 2016-03-14 LAB — CBC
HCT: 36.1 % (ref 36.0–46.0)
HEMOGLOBIN: 11.9 g/dL — AB (ref 12.0–15.0)
MCHC: 33 g/dL (ref 30.0–36.0)
MCV: 86.2 fl (ref 78.0–100.0)
Platelets: 248 10*3/uL (ref 150.0–400.0)
RBC: 4.19 Mil/uL (ref 3.87–5.11)
RDW: 14.1 % (ref 11.5–15.5)
WBC: 5.7 10*3/uL (ref 4.0–10.5)

## 2016-03-14 LAB — VITAMIN D 25 HYDROXY (VIT D DEFICIENCY, FRACTURES): VITD: 35.86 ng/mL (ref 30.00–100.00)

## 2016-03-14 MED ORDER — FLUTICASONE PROPIONATE 50 MCG/ACT NA SUSP: 2.0000 | Freq: Every day | NASAL | Status: DC

## 2016-03-14 NOTE — Assessment & Plan Note (Signed)
Checking labs, up to date on pap smear and mammogram. She is exercising and non-smoker.

## 2016-03-14 NOTE — Assessment & Plan Note (Signed)
Reminded her about working on limiting sugary beverages and keeping up with her exercises.

## 2016-03-14 NOTE — Assessment & Plan Note (Signed)
Recheck x-ray left leg to follow for stability. If change in pain or symptoms can recheck in the future.

## 2016-03-14 NOTE — Patient Instructions (Signed)
We will check the blood work today and we will call you back with the results.   I will check with Dr. Tamala Julian to see if we need to check another x-ray of the leg every so often to check on the extra calcium that is there.   Health Maintenance, Female Adopting a healthy lifestyle and getting preventive care can go a long way to promote health and wellness. Talk with your health care provider about what schedule of regular examinations is right for you. This is a good chance for you to check in with your provider about disease prevention and staying healthy. In between checkups, there are plenty of things you can do on your own. Experts have done a lot of research about which lifestyle changes and preventive measures are most likely to keep you healthy. Ask your health care provider for more information. WEIGHT AND DIET  Eat a healthy diet  Be sure to include plenty of vegetables, fruits, low-fat dairy products, and lean protein.  Do not eat a lot of foods high in solid fats, added sugars, or salt.  Get regular exercise. This is one of the most important things you can do for your health.  Most adults should exercise for at least 150 minutes each week. The exercise should increase your heart rate and make you sweat (moderate-intensity exercise).  Most adults should also do strengthening exercises at least twice a week. This is in addition to the moderate-intensity exercise.  Maintain a healthy weight  Body mass index (BMI) is a measurement that can be used to identify possible weight problems. It estimates body fat based on height and weight. Your health care provider can help determine your BMI and help you achieve or maintain a healthy weight.  For females 54 years of age and older:   A BMI below 18.5 is considered underweight.  A BMI of 18.5 to 24.9 is normal.  A BMI of 25 to 29.9 is considered overweight.  A BMI of 30 and above is considered obese.  Watch levels of cholesterol and  blood lipids  You should start having your blood tested for lipids and cholesterol at 44 years of age, then have this test every 5 years.  You may need to have your cholesterol levels checked more often if:  Your lipid or cholesterol levels are high.  You are older than 44 years of age.  You are at high risk for heart disease.  CANCER SCREENING   Lung Cancer  Lung cancer screening is recommended for adults 32-30 years old who are at high risk for lung cancer because of a history of smoking.  A yearly low-dose CT scan of the lungs is recommended for people who:  Currently smoke.  Have quit within the past 15 years.  Have at least a 30-pack-year history of smoking. A pack year is smoking an average of one pack of cigarettes a day for 1 year.  Yearly screening should continue until it has been 15 years since you quit.  Yearly screening should stop if you develop a health problem that would prevent you from having lung cancer treatment.  Breast Cancer  Practice breast self-awareness. This means understanding how your breasts normally appear and feel.  It also means doing regular breast self-exams. Let your health care provider know about any changes, no matter how small.  If you are in your 20s or 30s, you should have a clinical breast exam (CBE) by a health care provider every 1-3 years  as part of a regular health exam.  If you are 40 or older, have a CBE every year. Also consider having a breast X-ray (mammogram) every year.  If you have a family history of breast cancer, talk to your health care provider about genetic screening.  If you are at high risk for breast cancer, talk to your health care provider about having an MRI and a mammogram every year.  Breast cancer gene (BRCA) assessment is recommended for women who have family members with BRCA-related cancers. BRCA-related cancers include:  Breast.  Ovarian.  Tubal.  Peritoneal cancers.  Results of the  assessment will determine the need for genetic counseling and BRCA1 and BRCA2 testing. Cervical Cancer Your health care provider may recommend that you be screened regularly for cancer of the pelvic organs (ovaries, uterus, and vagina). This screening involves a pelvic examination, including checking for microscopic changes to the surface of your cervix (Pap test). You may be encouraged to have this screening done every 3 years, beginning at age 79.  For women ages 73-65, health care providers may recommend pelvic exams and Pap testing every 3 years, or they may recommend the Pap and pelvic exam, combined with testing for human papilloma virus (HPV), every 5 years. Some types of HPV increase your risk of cervical cancer. Testing for HPV may also be done on women of any age with unclear Pap test results.  Other health care providers may not recommend any screening for nonpregnant women who are considered low risk for pelvic cancer and who do not have symptoms. Ask your health care provider if a screening pelvic exam is right for you.  If you have had past treatment for cervical cancer or a condition that could lead to cancer, you need Pap tests and screening for cancer for at least 20 years after your treatment. If Pap tests have been discontinued, your risk factors (such as having a new sexual partner) need to be reassessed to determine if screening should resume. Some women have medical problems that increase the chance of getting cervical cancer. In these cases, your health care provider may recommend more frequent screening and Pap tests. Colorectal Cancer  This type of cancer can be detected and often prevented.  Routine colorectal cancer screening usually begins at 44 years of age and continues through 44 years of age.  Your health care provider may recommend screening at an earlier age if you have risk factors for colon cancer.  Your health care provider may also recommend using home test kits  to check for hidden blood in the stool.  A small camera at the end of a tube can be used to examine your colon directly (sigmoidoscopy or colonoscopy). This is done to check for the earliest forms of colorectal cancer.  Routine screening usually begins at age 15.  Direct examination of the colon should be repeated every 5-10 years through 44 years of age. However, you may need to be screened more often if early forms of precancerous polyps or small growths are found. Skin Cancer  Check your skin from head to toe regularly.  Tell your health care provider about any new moles or changes in moles, especially if there is a change in a mole's shape or color.  Also tell your health care provider if you have a mole that is larger than the size of a pencil eraser.  Always use sunscreen. Apply sunscreen liberally and repeatedly throughout the day.  Protect yourself by wearing long sleeves,  pants, a wide-brimmed hat, and sunglasses whenever you are outside. HEART DISEASE, DIABETES, AND HIGH BLOOD PRESSURE   High blood pressure causes heart disease and increases the risk of stroke. High blood pressure is more likely to develop in:  People who have blood pressure in the high end of the normal range (130-139/85-89 mm Hg).  People who are overweight or obese.  People who are African American.  If you are 69-36 years of age, have your blood pressure checked every 3-5 years. If you are 30 years of age or older, have your blood pressure checked every year. You should have your blood pressure measured twice--once when you are at a hospital or clinic, and once when you are not at a hospital or clinic. Record the average of the two measurements. To check your blood pressure when you are not at a hospital or clinic, you can use:  An automated blood pressure machine at a pharmacy.  A home blood pressure monitor.  If you are between 36 years and 90 years old, ask your health care provider if you should  take aspirin to prevent strokes.  Have regular diabetes screenings. This involves taking a blood sample to check your fasting blood sugar level.  If you are at a normal weight and have a low risk for diabetes, have this test once every three years after 44 years of age.  If you are overweight and have a high risk for diabetes, consider being tested at a younger age or more often. PREVENTING INFECTION  Hepatitis B  If you have a higher risk for hepatitis B, you should be screened for this virus. You are considered at high risk for hepatitis B if:  You were born in a country where hepatitis B is common. Ask your health care provider which countries are considered high risk.  Your parents were born in a high-risk country, and you have not been immunized against hepatitis B (hepatitis B vaccine).  You have HIV or AIDS.  You use needles to inject street drugs.  You live with someone who has hepatitis B.  You have had sex with someone who has hepatitis B.  You get hemodialysis treatment.  You take certain medicines for conditions, including cancer, organ transplantation, and autoimmune conditions. Hepatitis C  Blood testing is recommended for:  Everyone born from [birth year] through 1964.  Anyone with known risk factors for hepatitis C. Sexually transmitted infections (STIs)  You should be screened for sexually transmitted infections (STIs) including gonorrhea and chlamydia if:  You are sexually active and are younger than 44 years of age.  You are older than 43 years of age and your health care provider tells you that you are at risk for this type of infection.  Your sexual activity has changed since you were last screened and you are at an increased risk for chlamydia or gonorrhea. Ask your health care provider if you are at risk.  If you do not have HIV, but are at risk, it may be recommended that you take a prescription medicine daily to prevent HIV infection. This is called  pre-exposure prophylaxis (PrEP). You are considered at risk if:  You are sexually active and do not regularly use condoms or know the HIV status of your partner(s).  You take drugs by injection.  You are sexually active with a partner who has HIV. Talk with your health care provider about whether you are at high risk of being infected with HIV. If you choose to  begin PrEP, you should first be tested for HIV. You should then be tested every 3 months for as long as you are taking PrEP.  PREGNANCY   If you are premenopausal and you may become pregnant, ask your health care provider about preconception counseling.  If you may become pregnant, take 400 to 800 micrograms (mcg) of folic acid every day.  If you want to prevent pregnancy, talk to your health care provider about birth control (contraception). OSTEOPOROSIS AND MENOPAUSE   Osteoporosis is a disease in which the bones lose minerals and strength with aging. This can result in serious bone fractures. Your risk for osteoporosis can be identified using a bone density scan.  If you are 73 years of age or older, or if you are at risk for osteoporosis and fractures, ask your health care provider if you should be screened.  Ask your health care provider whether you should take a calcium or vitamin D supplement to lower your risk for osteoporosis.  Menopause may have certain physical symptoms and risks.  Hormone replacement therapy may reduce some of these symptoms and risks. Talk to your health care provider about whether hormone replacement therapy is right for you.  HOME CARE INSTRUCTIONS   Schedule regular health, dental, and eye exams.  Stay current with your immunizations.   Do not use any tobacco products including cigarettes, chewing tobacco, or electronic cigarettes.  If you are pregnant, do not drink alcohol.  If you are breastfeeding, limit how much and how often you drink alcohol.  Limit alcohol intake to no more than 1  drink per day for nonpregnant women. One drink equals 12 ounces of beer, 5 ounces of wine, or 1 ounces of hard liquor.  Do not use street drugs.  Do not share needles.  Ask your health care provider for help if you need support or information about quitting drugs.  Tell your health care provider if you often feel depressed.  Tell your health care provider if you have ever been abused or do not feel safe at home.   This information is not intended to replace advice given to you by your health care provider. Make sure you discuss any questions you have with your health care provider.   Document Released: 05/28/2011 Document Revised: 12/03/2014 Document Reviewed: 10/14/2013 Elsevier Interactive Patient Education [employer].

## 2016-03-14 NOTE — Progress Notes (Signed)
   Subjective:    Patient ID: Rachel Graham, female    DOB: 1971/07/02, 45 y.o.   MRN: [id number]  HPI The patient is a 45 YO female coming in for wellness. No new concerns, her knee pain is a lot better but still present. Taking aleve occasionally for it.   PMH, [facility], social history reviewed and updated.   Review of Systems  Constitutional: Negative for fever, activity change, appetite change, fatigue and unexpected weight change.  HENT: Negative.   Eyes: Negative.   Respiratory: Negative for cough, chest tightness, shortness of breath and wheezing.   Cardiovascular: Negative for chest pain, palpitations and leg swelling.  Gastrointestinal: Negative for abdominal pain, diarrhea, constipation and abdominal distention.  Musculoskeletal: Positive for arthralgias. Negative for myalgias and gait problem.  Skin: Negative.   Neurological: Negative.   Psychiatric/Behavioral: Negative.       Objective:   Physical Exam  Constitutional: She is oriented to person, place, and time. She appears well-developed and well-nourished.  Overweight  HENT:  Head: Normocephalic and atraumatic.  Eyes: EOM are normal.  Neck: Normal range of motion.  Cardiovascular: Normal rate and regular rhythm.   Pulmonary/Chest: Effort normal and breath sounds normal.  Abdominal: Soft. Bowel sounds are normal. She exhibits no distension. There is no tenderness. There is no rebound.  Musculoskeletal: She exhibits no edema.  Neurological: She is alert and oriented to person, place, and time. Coordination normal.  Skin: Skin is warm and dry.  Psychiatric: She has a normal mood and affect.   Filed Vitals:   03/14/16 0805  BP: 142/92  Pulse: 78  Temp: 98.6 F (37 C)  TempSrc: Oral  Resp: 14  Height: 5\' 3"  (1.6 m)  Weight: 204 lb (92.534 kg)  SpO2: 98%      Assessment & Plan:

## 2016-03-14 NOTE — Progress Notes (Signed)
Pre visit review using our clinic review tool, if applicable. No additional management support is needed unless otherwise documented below in the visit note. 

## 2016-06-19 DIAGNOSIS — H5213 Myopia, bilateral: Secondary | ICD-10-CM | POA: Diagnosis not present

## 2016-06-25 ENCOUNTER — Encounter: Payer: Self-pay | Admitting: Internal Medicine

## 2016-06-25 ENCOUNTER — Ambulatory Visit (INDEPENDENT_AMBULATORY_CARE_PROVIDER_SITE_OTHER): Payer: 59 | Admitting: Internal Medicine

## 2016-06-25 DIAGNOSIS — R21 Rash and other nonspecific skin eruption: Secondary | ICD-10-CM | POA: Diagnosis not present

## 2016-06-25 MED ORDER — TRIAMCINOLONE ACETONIDE 0.1 % EX CREA: 1.0000 "application " | TOPICAL_CREAM | Freq: Two times a day (BID) | CUTANEOUS | 0 refills | Status: DC

## 2016-06-25 MED FILL — TRIAMCINOLONE 0.1% CREAM: 0.1 | 15 days supply | Qty: 80 | Fill #0

## 2016-06-25 MED FILL — FLUTICASONE PROP 50 MCG SPR: 50 | 30 days supply | Qty: 16 | Fill #0

## 2016-06-25 NOTE — Patient Instructions (Signed)
We have sent in triamcinolone cream for the itchy spot. You can try taking benadryl to help out with the itching as well.   Use the cream twice a day to help and it should be better within 1 week.

## 2016-06-25 NOTE — Progress Notes (Signed)
Pre visit review using our clinic review tool, if applicable. No additional management support is needed unless otherwise documented below in the visit note. 

## 2016-06-25 NOTE — Assessment & Plan Note (Signed)
Rx for triamcinolone cream for the area, does not appear to be fungal on exam.

## 2016-06-25 NOTE — Progress Notes (Signed)
   Subjective:    Patient ID: Rachel Graham, female    DOB: [birth year], 45 y.o.   MRN: [id number]  HPI The patient is a 45 YO female coming in for rash on her buttocks. Going on for about 1 month, no change to soap, shampoo, clothes. She does not do outdoor activities. She has tried cortisone cream and other products over the counter without relief. No fevers or chills. No open sores and she has been trying not to scratch.   Review of Systems  Constitutional: Negative for activity change, appetite change, fatigue, fever and unexpected weight change.  HENT: Negative.   Respiratory: Negative for cough, chest tightness, shortness of breath and wheezing.   Cardiovascular: Negative for chest pain, palpitations and leg swelling.  Gastrointestinal: Negative for abdominal distention, abdominal pain, constipation and diarrhea.  Skin: Positive for rash. Negative for color change, pallor and wound.      Objective:   Physical Exam  Constitutional: She is oriented to person, place, and time. She appears well-developed and well-nourished.  Overweight  HENT:  Head: Normocephalic and atraumatic.  Eyes: EOM are normal.  Neck: Normal range of motion.  Cardiovascular: Normal rate and regular rhythm.   Pulmonary/Chest: Effort normal and breath sounds normal.  Abdominal: Soft. Bowel sounds are normal. She exhibits no distension. There is no tenderness. There is no rebound.  Neurological: She is alert and oriented to person, place, and time.  Skin: Skin is warm and dry.  Slight rash on the buttocks, without open sores or pimples.   Psychiatric: She has a normal mood and affect.   Vitals:   06/25/16 1537  BP: 116/72  Pulse: 72  Temp: 98.5 F (36.9 C)  SpO2: 99%  Weight: 210 lb (95.3 kg)      Assessment & Plan:

## 2016-06-27 MED FILL — MOXIFLOXACIN 0.5% EYE DROPS: 0.5 | 15 days supply | Qty: 3 | Fill #0

## 2016-12-03 ENCOUNTER — Ambulatory Visit (INDEPENDENT_AMBULATORY_CARE_PROVIDER_SITE_OTHER): Payer: 59 | Admitting: Internal Medicine

## 2016-12-03 ENCOUNTER — Encounter: Payer: Self-pay | Admitting: Internal Medicine

## 2016-12-03 DIAGNOSIS — S39012A Strain of muscle, fascia and tendon of lower back, initial encounter: Secondary | ICD-10-CM

## 2016-12-03 MED ORDER — CYCLOBENZAPRINE HCL 5 MG PO TABS: 5.0000 mg | ORAL_TABLET | Freq: Three times a day (TID) | ORAL | 1 refills | Status: DC | PRN

## 2016-12-03 MED FILL — CYCLOBENZAPRINE 5 MG TABLET: 5 | 30 days supply | Qty: 90 | Fill #0

## 2016-12-03 NOTE — Assessment & Plan Note (Signed)
Rx for flexeril. She is using aleve at home and will continue. No indication for imaging today. Talked to her about back injury prevention for the future.

## 2016-12-03 NOTE — Progress Notes (Signed)
   Subjective:    Patient ID: Rachel Graham, female    DOB: 06/03/1971, 46 y.o.   MRN: [id number]  HPI The patient is a 46 YO female coming in for lower back pain. Started about 1 week ago with zumba class and some extra exercise afterwards with her friends. She is having low back pain with some radiation into her legs, denies weakness or numbness in her legs. She denies change to bowel or bladder sensation or function. Pain is moderate to severe since onset. Overall it is gradually improving but still moderate at this time. Hurts all day and night without reprieve. No fevers or chills.   Review of Systems  Constitutional: Positive for activity change. Negative for appetite change, chills, fatigue, fever and unexpected weight change.  Respiratory: Negative.   Cardiovascular: Negative.   Gastrointestinal: Negative.   Musculoskeletal: Positive for back pain. Negative for arthralgias, gait problem, joint swelling, myalgias, neck pain and neck stiffness.  Skin: Negative.       Objective:   Physical Exam  Constitutional: She is oriented to person, place, and time. She appears well-developed and well-nourished.  HENT:  Head: Normocephalic and atraumatic.  Eyes: EOM are normal.  Cardiovascular: Normal rate and regular rhythm.   Pulmonary/Chest: Effort normal and breath sounds normal.  Abdominal: Soft.  Musculoskeletal:  Pain in the low lumbar region paraspinally and midline, no radiation with palpation but some radiation with walking.   Neurological: She is alert and oriented to person, place, and time.  Skin: Skin is warm and dry.   Vitals:   12/03/16 0824  BP: 140/90  Pulse: 69  Resp: 14  Temp: 98.2 F (36.8 C)  TempSrc: Oral  SpO2: 99%  Weight: 218 lb (98.9 kg)  Height: 5\' 3"  (1.6 m)      Assessment & Plan:

## 2016-12-03 NOTE — Patient Instructions (Signed)
We have sent in the muscle relaxer for the back. It is called flexeril (cyclobenzaprine) and you can take it up to 3 times per day for the pain.  It is okay to keep doing the heating pads, icy hot, or aleve if they are helpful.   This should get better in the next 1-2 weeks. If it is not getting better give [place] a call back.   Back Injury Prevention Back injuries can be very painful. They can also be difficult to heal. After having one back injury, you are more likely to injure your back again. It is important to learn how to avoid injuring or re-injuring your back. The following tips can help you to prevent a back injury. What should I know about physical fitness?  Exercise for 30 minutes per day on most days of the week or as directed by your health care provider. Make sure to:  Do aerobic exercises, such as walking, jogging, biking, or swimming.  Do exercises that increase balance and strength, such as tai chi and yoga. These can decrease your risk of falling and injuring your back.  Do stretching exercises to help with flexibility.  Try to develop strong abdominal muscles. Your abdominal muscles provide a lot of the support that is needed by your back.  Maintain a healthy weight. This helps to decrease your risk of a back injury. What should I know about my diet?  Talk with your health care provider about your overall diet. Take supplements and vitamins only as directed by your health care provider.  Talk with your health care provider about how much calcium and vitamin D you need each day. These nutrients help to prevent weakening of the bones (osteoporosis). Osteoporosis can cause broken (fractured) bones, which lead to back pain.  Include good sources of calcium in your diet, such as dairy products, green leafy vegetables, and products that have had calcium added to them (fortified).  Include good sources of vitamin D in your diet, such as milk and foods that are fortified with  vitamin D. What should I know about my posture?  Sit up straight and stand up straight. Avoid leaning forward when you sit or hunching over when you stand.  Choose chairs that have good low-back (lumbar) support.  If you work at a desk, sit close to it so you do not need to lean over. Keep your chin tucked in. Keep your neck drawn back, and keep your elbows bent at a right angle. Your arms should look like the letter "L."  Sit high and close to the steering wheel when you drive. Add a lumbar support to your car seat, if needed.  Avoid sitting or standing in one position for very long. Take breaks to get up, stretch, and walk around at least one time every hour. Take breaks every hour if you are driving for long periods of time.  Sleep on your side with your knees slightly bent, or sleep on your back with a pillow under your knees. Do not lie on the front of your body to sleep. What should I know about lifting, twisting, and reaching? Lifting and Heavy Lifting   Avoid heavy lifting, especially repetitive heavy lifting. If you must do heavy lifting:  Stretch before lifting.  Work slowly.  Rest between lifts.  Use a tool such as a cart or a dolly to move objects if one is available.  Make several small trips instead of carrying one heavy load.  Ask for  help when you need it, especially when moving big objects.  Follow these steps when lifting:  Stand with your feet shoulder-width apart.  Get as close to the object as you can. Do not try to pick up a heavy object that is far from your body.  Use handles or lifting straps if they are available.  Bend at your knees. Squat down, but keep your heels off the floor.  Keep your shoulders pulled back, your chin tucked in, and your back straight.  Lift the object slowly while you tighten the muscles in your legs, abdomen, and buttocks. Keep the object as close to the center of your body as possible.  Follow these steps when putting  down a heavy load:  Stand with your feet shoulder-width apart.  Lower the object slowly while you tighten the muscles in your legs, abdomen, and buttocks. Keep the object as close to the center of your body as possible.  Keep your shoulders pulled back, your chin tucked in, and your back straight.  Bend at your knees. Squat down, but keep your heels off the floor.  Use handles or lifting straps if they are available. Twisting and Reaching  Avoid lifting heavy objects above your waist.  Do not twist at your waist while you are lifting or carrying a load. If you need to turn, move your feet.  Do not bend over without bending at your knees.  Avoid reaching over your head, across a table, or for an object on a high surface. What are some other tips?  Avoid wet floors and icy ground. Keep sidewalks clear of ice to prevent falls.  Do not sleep on a mattress that is too soft or too hard.  Keep items that are used frequently within easy reach.  Put heavier objects on shelves at waist level, and put lighter objects on lower or higher shelves.  Find ways to decrease your stress, such as exercise, massage, or relaxation techniques. Stress can build up in your muscles. Tense muscles are more vulnerable to injury.  Talk with your health care provider if you feel anxious or depressed. These conditions can make back pain worse.  Wear flat heel shoes with cushioned soles.  Avoid sudden movements.  Use both shoulder straps when carrying a backpack.  Do not use any tobacco products, including cigarettes, chewing tobacco, or electronic cigarettes. If you need help quitting, ask your health care provider. This information is not intended to replace advice given to you by your health care provider. Make sure you discuss any questions you have with your health care provider. Document Released: 12/20/2004 Document Revised: 04/19/2016 Document Reviewed: 11/16/2014 Elsevier Interactive Patient  Education  2017 [employer].

## 2016-12-03 NOTE — Progress Notes (Signed)
Pre visit review using our clinic review tool, if applicable. No additional management support is needed unless otherwise documented below in the visit note. 

## 2017-04-09 DIAGNOSIS — H00024 Hordeolum internum left upper eyelid: Secondary | ICD-10-CM | POA: Diagnosis not present

## 2017-04-09 DIAGNOSIS — H00014 Hordeolum externum left upper eyelid: Secondary | ICD-10-CM | POA: Diagnosis not present

## 2017-04-09 MED FILL — DOXYCYCLINE HYCLATE 100 MG: 100 | 14 days supply | Qty: 28 | Fill #0

## 2017-04-12 MED FILL — AMOX-CLAV 500-125 MG TABLET: 500-125 | 7 days supply | Qty: 14 | Fill #0

## 2017-06-25 DIAGNOSIS — Z01419 Encounter for gynecological examination (general) (routine) without abnormal findings: Secondary | ICD-10-CM | POA: Diagnosis not present

## 2017-06-25 DIAGNOSIS — Z1151 Encounter for screening for human papillomavirus (HPV): Secondary | ICD-10-CM | POA: Diagnosis not present

## 2017-06-25 DIAGNOSIS — Z6838 Body mass index (BMI) 38.0-38.9, adult: Secondary | ICD-10-CM | POA: Diagnosis not present

## 2017-06-25 DIAGNOSIS — Z1231 Encounter for screening mammogram for malignant neoplasm of breast: Secondary | ICD-10-CM | POA: Diagnosis not present

## 2017-08-03 ENCOUNTER — Emergency Department (HOSPITAL_COMMUNITY)
Admission: EM | Admit: 2017-08-03 | Discharge: 2017-08-03 | Disposition: A | Discharge status: Home / Self Care | Payer: 59 | Attending: Emergency Medicine | Admitting: Emergency Medicine

## 2017-08-03 ENCOUNTER — Encounter (HOSPITAL_COMMUNITY): Payer: Self-pay | Admitting: Emergency Medicine

## 2017-08-03 DIAGNOSIS — S0502XA Injury of conjunctiva and corneal abrasion without foreign body, left eye, initial encounter: Secondary | ICD-10-CM | POA: Insufficient documentation

## 2017-08-03 DIAGNOSIS — Y9389 Activity, other specified: Secondary | ICD-10-CM | POA: Diagnosis not present

## 2017-08-03 DIAGNOSIS — Y929 Unspecified place or not applicable: Secondary | ICD-10-CM | POA: Diagnosis not present

## 2017-08-03 DIAGNOSIS — S0592XA Unspecified injury of left eye and orbit, initial encounter: Secondary | ICD-10-CM | POA: Diagnosis present

## 2017-08-03 DIAGNOSIS — Y999 Unspecified external cause status: Secondary | ICD-10-CM | POA: Diagnosis not present

## 2017-08-03 DIAGNOSIS — X58XXXA Exposure to other specified factors, initial encounter: Secondary | ICD-10-CM | POA: Diagnosis not present

## 2017-08-03 DIAGNOSIS — Z79899 Other long term (current) drug therapy: Secondary | ICD-10-CM | POA: Insufficient documentation

## 2017-08-03 DIAGNOSIS — S0502XD Injury of conjunctiva and corneal abrasion without foreign body, left eye, subsequent encounter: Secondary | ICD-10-CM | POA: Diagnosis not present

## 2017-08-03 MED ORDER — FLUORESCEIN SODIUM 0.6 MG OP STRP
1.0000 | ORAL_STRIP | Freq: Once | OPHTHALMIC | Status: AC
  Administered 2017-08-03: 1 via OPHTHALMIC
  Filled 2017-08-03: qty 1

## 2017-08-03 MED ORDER — CIPROFLOXACIN HCL 0.3 % OP SOLN: OPHTHALMIC | 0 refills | Status: DC

## 2017-08-03 MED ORDER — TETRACAINE HCL 0.5 % OP SOLN
2.0000 [drp] | Freq: Once | OPHTHALMIC | Status: AC
  Administered 2017-08-03: 2 [drp] via OPHTHALMIC
  Filled 2017-08-03: qty 4

## 2017-08-03 NOTE — ED Triage Notes (Signed)
Pt is unable to get contact out of her eye, she has been trying for over 3 hours.

## 2017-08-03 NOTE — ED Provider Notes (Signed)
[facility] DEPT Provider Note   CSN: [id number] Arrival date & time: 08/03/17  0000     History   Chief Complaint Chief Complaint  Patient presents with  . Foreign Body in [place] is a 46 y.o. female.  46 yo F with a cc of FB sensation to the left eye.  She thinks she has a contact lens stuck in her eye. She has tried multiple times but is unable to get it down. She denies fracture of the contact lens. Denies welding or iron working. Having some watery drainage.   The history is provided by the patient.  Foreign Body in Eye  This is a new problem. The current episode started 1 to 2 hours ago. The problem occurs constantly. The problem has not changed since onset.Pertinent negatives include no chest pain, no headaches and no shortness of breath. Nothing aggravates the symptoms. Nothing relieves the symptoms. She has tried nothing for the symptoms. The treatment provided no relief.    Past Medical History:  Diagnosis Date  . Blood in stool   . Contact lens/glasses fitting    wears glases or contacts  . Medical history non-contributory     Patient Active Problem List   Diagnosis Date Noted  . Low back strain 12/03/2016  . Rash and nonspecific skin eruption 06/25/2016  . Osteochondroma 05/31/2015  . Allergic rhinitis 03/24/2015  . Obesity 02/25/2015  . Routine general medical examination at a health care facility 02/25/2015    Past Surgical History:  Procedure Laterality Date  . DIAGNOSTIC LAPAROSCOPY  2010   lt ovarian cyst-LOA  . fibroids surgery  2008   d/c hysteroscopy  . GANGLION CYST EXCISION Right 04/27/2014   Procedure: RIGHT WRIST EXCISION OF MASS ;  Surgeon: Tennis Must, MD;  Location: [facility];  Service: Orthopedics;  Laterality: Right;    OB History    No data available       Home Medications    Prior to Admission medications   Medication Sig Start Date End Date Taking? Authorizing Provider    ciprofloxacin (CILOXAN) 0.3 % ophthalmic solution Administer 1 drop, every 2 hours, while awake, for 2 days. Then 1 drop, every 4 hours, while awake, for the next 5 days. 08/03/17   Deno Etienne, DO  cyclobenzaprine (FLEXERIL) 5 MG tablet Take 1 tablet (5 mg total) by mouth 3 (three) times daily as needed for muscle spasms. 12/03/16   Hoyt Koch, MD  fluticasone Asencion Islam) 50 MCG/ACT nasal spray Place 2 sprays into both nostrils daily. 03/14/16   Hoyt Koch, MD  triamcinolone cream (KENALOG) 0.1 % Apply 1 application topically 2 (two) times daily. 06/25/16   Hoyt Koch, MD    Family History Family History  Problem Relation Age of Onset  . Diabetes Mother   . Diabetes Father   . Diabetes Sister   . Diabetes Brother     Social History Social History  Substance Use Topics  . Smoking status: Never Smoker  . Smokeless tobacco: Not on file  . Alcohol use Yes     Comment: occasional     Allergies   Patient has no known allergies.   Review of Systems Review of Systems  Constitutional: Negative for chills and fever.  HENT: Negative for congestion and rhinorrhea.   Eyes: Negative for redness and visual disturbance.  Respiratory: Negative for shortness of breath and wheezing.   Cardiovascular: Negative for chest pain and palpitations.  Gastrointestinal: Negative for nausea and vomiting.  Genitourinary: Negative for dysuria and urgency.  Musculoskeletal: Negative for arthralgias and myalgias.  Skin: Negative for pallor and wound.  Neurological: Negative for dizziness and headaches.     Physical Exam Updated Vital Signs BP 109/62   Pulse 61   Temp 98.3 F (36.8 C) (Oral)   Resp 18   SpO2 100%   Physical Exam  Constitutional: She is oriented to person, place, and time. She appears well-developed and well-nourished. No distress.  HENT:  Head: Normocephalic and atraumatic.  Eyes: Pupils are equal, round, and reactive to light. EOM are normal. Left  eye exhibits discharge (watery). Left eye exhibits no chemosis and no exudate. No foreign body present in the left eye. Left conjunctiva is injected. Left conjunctiva has no hemorrhage.    Neck: Normal range of motion. Neck supple.  Cardiovascular: Normal rate and regular rhythm.  Exam reveals no gallop and no friction rub.   No murmur heard. Pulmonary/Chest: Effort normal. She has no wheezes. She has no rales.  Abdominal: Soft. She exhibits no distension and no mass. There is no tenderness. There is no guarding.  Musculoskeletal: She exhibits no edema or tenderness.  Neurological: She is alert and oriented to person, place, and time.  Skin: Skin is warm and dry. She is not diaphoretic.  Psychiatric: She has a normal mood and affect. Her behavior is normal.  Nursing note and vitals reviewed.    ED Treatments / Results  Labs (all labs ordered are listed, but only abnormal results are displayed) Labs Reviewed - No data to display  EKG  EKG Interpretation None       Radiology No results found.  Procedures Procedures (including critical care time)  Medications Ordered in ED Medications  tetracaine (PONTOCAINE) 0.5 % ophthalmic solution 2 drop (2 drops Both Eyes Given 08/03/17 0339)  fluorescein ophthalmic strip 1 strip (1 strip Both Eyes Given 08/03/17 0339)     Initial Impression / Assessment and Plan / ED Course  I have reviewed the triage vital signs and the nursing notes.  Pertinent labs & imaging results that were available during my care of the patient were reviewed by me and considered in my medical decision making (see chart for details).     46 yo F with a chief complaint of a foreign body sensation to the left eye. The started a couple hours ago. The patient thinks her contact lenses still stuck in there. No noted foreign body on exam. She does have corneal abrasion. Started on antibiotic drops. Optho follow up.   6:41 AM:  I have discussed the  diagnosis/risks/treatment options with the patient and family and believe the pt to be eligible for discharge home to follow-up with optho. We also discussed returning to the ED immediately if new or worsening sx occur. We discussed the sx which are most concerning (e.g., sudden worsening pain, fever, inability to tolerate by mouth) that necessitate immediate return. Medications administered to the patient during their visit and any new prescriptions provided to the patient are listed below.  Medications given during this visit Medications  tetracaine (PONTOCAINE) 0.5 % ophthalmic solution 2 drop (2 drops Both Eyes Given 08/03/17 0339)  fluorescein ophthalmic strip 1 strip (1 strip Both Eyes Given 08/03/17 0339)     The patient appears reasonably screen and/or stabilized for discharge and I doubt any other medical condition or other [facility] requiring further screening, evaluation, or treatment in the ED at this time prior to  discharge.    Final Clinical Impressions(s) / ED Diagnoses   Final diagnoses:  Abrasion of left cornea, initial encounter    New Prescriptions Discharge Medication List as of 08/03/2017  3:49 AM    START taking these medications   Details  ciprofloxacin (CILOXAN) 0.3 % ophthalmic solution Administer 1 drop, every 2 hours, while awake, for 2 days. Then 1 drop, every 4 hours, while awake, for the next 5 days., Johnson, Linna Hoff, DO 08/03/17 [phone]

## 2017-08-08 ENCOUNTER — Encounter: Payer: 59 | Admitting: Internal Medicine

## 2017-08-29 ENCOUNTER — Ambulatory Visit (INDEPENDENT_AMBULATORY_CARE_PROVIDER_SITE_OTHER): Payer: 59 | Admitting: Internal Medicine

## 2017-08-29 ENCOUNTER — Other Ambulatory Visit (INDEPENDENT_AMBULATORY_CARE_PROVIDER_SITE_OTHER): Payer: 59

## 2017-08-29 ENCOUNTER — Encounter: Payer: Self-pay | Admitting: Internal Medicine

## 2017-08-29 VITALS — BP 128/80 | HR 57 | Temp 98.2°F | Ht 63.0 in | Wt 213.0 lb

## 2017-08-29 DIAGNOSIS — Z6837 Body mass index (BMI) 37.0-37.9, adult: Secondary | ICD-10-CM

## 2017-08-29 DIAGNOSIS — Z0000 Encounter for general adult medical examination without abnormal findings: Secondary | ICD-10-CM

## 2017-08-29 DIAGNOSIS — E669 Obesity, unspecified: Secondary | ICD-10-CM | POA: Diagnosis not present

## 2017-08-29 DIAGNOSIS — E66812 Obesity, class 2: Secondary | ICD-10-CM

## 2017-08-29 DIAGNOSIS — J3089 Other allergic rhinitis: Secondary | ICD-10-CM | POA: Diagnosis not present

## 2017-08-29 LAB — CBC
HCT: 36.5 % (ref 36.0–46.0)
Hemoglobin: 11.8 g/dL — ABNORMAL LOW (ref 12.0–15.0)
MCHC: 32.4 g/dL (ref 30.0–36.0)
MCV: 86.6 fl (ref 78.0–100.0)
Platelets: 270 10*3/uL (ref 150.0–400.0)
RBC: 4.21 Mil/uL (ref 3.87–5.11)
RDW: 14.1 % (ref 11.5–15.5)
WBC: 6.6 10*3/uL (ref 4.0–10.5)

## 2017-08-29 LAB — LIPID PANEL
CHOLESTEROL: 191 mg/dL (ref 0–200)
HDL: 62 mg/dL (ref >39.00)
LDL Cholesterol: 122 mg/dL — ABNORMAL HIGH (ref 0–99)
NONHDL: 129.36
Total CHOL/HDL Ratio: 3
Triglycerides: 38 mg/dL (ref 0.0–149.0)
VLDL: 7.6 mg/dL (ref 0.0–40.0)

## 2017-08-29 LAB — VITAMIN D 25 HYDROXY (VIT D DEFICIENCY, FRACTURES): VITD: 48.93 ng/mL (ref 30.00–100.00)

## 2017-08-29 LAB — TSH: TSH: 0.59 u[IU]/mL (ref 0.35–4.50)

## 2017-08-29 LAB — COMPREHENSIVE METABOLIC PANEL
ALT: 12 U/L (ref 0–35)
AST: 11 U/L (ref 0–37)
Albumin: 4.2 g/dL (ref 3.5–5.2)
Alkaline Phosphatase: 54 U/L (ref 39–117)
BILIRUBIN TOTAL: 0.3 mg/dL (ref 0.2–1.2)
BUN: 16 mg/dL (ref 6–23)
CO2: 29 mEq/L (ref 19–32)
Calcium: 9.3 mg/dL (ref 8.4–10.5)
Chloride: 105 mEq/L (ref 96–112)
Creatinine, Ser: 0.96 mg/dL (ref 0.40–1.20)
GFR: 80.56 mL/min (ref >60.00)
GLUCOSE: 123 mg/dL — AB (ref 70–99)
Potassium: 4.3 mEq/L (ref 3.5–5.1)
SODIUM: 139 meq/L (ref 135–145)
Total Protein: 7 g/dL (ref 6.0–8.3)

## 2017-08-29 LAB — HEMOGLOBIN A1C: HEMOGLOBIN A1C: 5.7 % (ref 4.6–6.5)

## 2017-08-29 MED ORDER — MONTELUKAST SODIUM 10 MG PO TABS: 10.0000 mg | ORAL_TABLET | Freq: Every day | ORAL | 3 refills | Status: DC

## 2017-08-29 MED ORDER — BUSPIRONE HCL 5 MG PO TABS: 5.0000 mg | ORAL_TABLET | Freq: Two times a day (BID) | ORAL | 6 refills | Status: DC

## 2017-08-29 MED FILL — MONTELUKAST SOD 10 MG TAB: 10 | 30 days supply | Qty: 30 | Fill #0

## 2017-08-29 MED FILL — busPIRone HCL 5 MG TABS: 5 | 30 days supply | Qty: 60 | Fill #0

## 2017-08-29 NOTE — Patient Instructions (Addendum)
We have sent in buspar which you can use up to twice a day as needed for anxiety and stress.   We have sent in singulair for the allergies which is 1 pill daily.   Health Maintenance, Female Adopting a healthy lifestyle and getting preventive care can go a long way to promote health and wellness. Talk with your health care provider about what schedule of regular examinations is right for you. This is a good chance for you to check in with your provider about disease prevention and staying healthy. In between checkups, there are plenty of things you can do on your own. Experts have done a lot of research about which lifestyle changes and preventive measures are most likely to keep you healthy. Ask your health care provider for more information. Weight and diet Eat a healthy diet  Be sure to include plenty of vegetables, fruits, low-fat dairy products, and lean protein.  Do not eat a lot of foods high in solid fats, added sugars, or salt.  Get regular exercise. This is one of the most important things you can do for your health. ? Most adults should exercise for at least 150 minutes each week. The exercise should increase your heart rate and make you sweat (moderate-intensity exercise). ? Most adults should also do strengthening exercises at least twice a week. This is in addition to the moderate-intensity exercise.  Maintain a healthy weight  Body mass index (BMI) is a measurement that can be used to identify possible weight problems. It estimates body fat based on height and weight. Your health care provider can help determine your BMI and help you achieve or maintain a healthy weight.  For females 24 years of age and older: ? A BMI below 18.5 is considered underweight. ? A BMI of 18.5 to 24.9 is normal. ? A BMI of 25 to 29.9 is considered overweight. ? A BMI of 30 and above is considered obese.  Watch levels of cholesterol and blood lipids  You should start having your blood tested for  lipids and cholesterol at 45 years of age, then have this test every 5 years.  You may need to have your cholesterol levels checked more often if: ? Your lipid or cholesterol levels are high. ? You are older than 45 years of age. ? You are at high risk for heart disease.  Cancer screening Lung Cancer  Lung cancer screening is recommended for adults 80-22 years old who are at high risk for lung cancer because of a history of smoking.  A yearly low-dose CT scan of the lungs is recommended for people who: ? Currently smoke. ? Have quit within the past 15 years. ? Have at least a 30-pack-year history of smoking. A pack year is smoking an average of one pack of cigarettes a day for 1 year.  Yearly screening should continue until it has been 15 years since you quit.  Yearly screening should stop if you develop a health problem that would prevent you from having lung cancer treatment.  Breast Cancer  Practice breast self-awareness. This means understanding how your breasts normally appear and feel.  It also means doing regular breast self-exams. Let your health care provider know about any changes, no matter how small.  If you are in your 20s or 30s, you should have a clinical breast exam (CBE) by a health care provider every 1-3 years as part of a regular health exam.  If you are 68 or older, have a CBE  every year. Also consider having a breast X-ray (mammogram) every year.  If you have a family history of breast cancer, talk to your health care provider about genetic screening.  If you are at high risk for breast cancer, talk to your health care provider about having an MRI and a mammogram every year.  Breast cancer gene (BRCA) assessment is recommended for women who have family members with BRCA-related cancers. BRCA-related cancers include: ? Breast. ? Ovarian. ? Tubal. ? Peritoneal cancers.  Results of the assessment will determine the need for genetic counseling and BRCA1 and  BRCA2 testing.  Cervical Cancer Your health care provider may recommend that you be screened regularly for cancer of the pelvic organs (ovaries, uterus, and vagina). This screening involves a pelvic examination, including checking for microscopic changes to the surface of your cervix (Pap test). You may be encouraged to have this screening done every 3 years, beginning at age 69.  For women ages 61-65, health care providers may recommend pelvic exams and Pap testing every 3 years, or they may recommend the Pap and pelvic exam, combined with testing for human papilloma virus (HPV), every 5 years. Some types of HPV increase your risk of cervical cancer. Testing for HPV may also be done on women of any age with unclear Pap test results.  Other health care providers may not recommend any screening for nonpregnant women who are considered low risk for pelvic cancer and who do not have symptoms. Ask your health care provider if a screening pelvic exam is right for you.  If you have had past treatment for cervical cancer or a condition that could lead to cancer, you need Pap tests and screening for cancer for at least 20 years after your treatment. If Pap tests have been discontinued, your risk factors (such as having a new sexual partner) need to be reassessed to determine if screening should resume. Some women have medical problems that increase the chance of getting cervical cancer. In these cases, your health care provider may recommend more frequent screening and Pap tests.  Colorectal Cancer  This type of cancer can be detected and often prevented.  Routine colorectal cancer screening usually begins at 45 years of age and continues through 45 years of age.  Your health care provider may recommend screening at an earlier age if you have risk factors for colon cancer.  Your health care provider may also recommend using home test kits to check for hidden blood in the stool.  A small camera at the  end of a tube can be used to examine your colon directly (sigmoidoscopy or colonoscopy). This is done to check for the earliest forms of colorectal cancer.  Routine screening usually begins at age 33.  Direct examination of the colon should be repeated every 5-10 years through 45 years of age. However, you may need to be screened more often if early forms of precancerous polyps or small growths are found.  Skin Cancer  Check your skin from head to toe regularly.  Tell your health care provider about any new moles or changes in moles, especially if there is a change in a mole's shape or color.  Also tell your health care provider if you have a mole that is larger than the size of a pencil eraser.  Always use sunscreen. Apply sunscreen liberally and repeatedly throughout the day.  Protect yourself by wearing long sleeves, pants, a wide-brimmed hat, and sunglasses whenever you are outside.  Heart disease, diabetes,  and high blood pressure  High blood pressure causes heart disease and increases the risk of stroke. High blood pressure is more likely to develop in: ? People who have blood pressure in the high end of the normal range (130-139/85-89 mm Hg). ? People who are overweight or obese. ? People who are African American.  If you are 48-19 years of age, have your blood pressure checked every 3-5 years. If you are 36 years of age or older, have your blood pressure checked every year. You should have your blood pressure measured twice-once when you are at a hospital or clinic, and once when you are not at a hospital or clinic. Record the average of the two measurements. To check your blood pressure when you are not at a hospital or clinic, you can use: ? An automated blood pressure machine at a pharmacy. ? A home blood pressure monitor.  If you are between 38 years and 67 years old, ask your health care provider if you should take aspirin to prevent strokes.  Have regular diabetes  screenings. This involves taking a blood sample to check your fasting blood sugar level. ? If you are at a normal weight and have a low risk for diabetes, have this test once every three years after 45 years of age. ? If you are overweight and have a high risk for diabetes, consider being tested at a younger age or more often. Preventing infection Hepatitis B  If you have a higher risk for hepatitis B, you should be screened for this virus. You are considered at high risk for hepatitis B if: ? You were born in a country where hepatitis B is common. Ask your health care provider which countries are considered high risk. ? Your parents were born in a high-risk country, and you have not been immunized against hepatitis B (hepatitis B vaccine). ? You have HIV or AIDS. ? You use needles to inject street drugs. ? You live with someone who has hepatitis B. ? You have had sex with someone who has hepatitis B. ? You get hemodialysis treatment. ? You take certain medicines for conditions, including cancer, organ transplantation, and autoimmune conditions.  Hepatitis C  Blood testing is recommended for: ? Everyone born from [birth year] through 1964. ? Anyone with known risk factors for hepatitis C.  Sexually transmitted infections (STIs)  You should be screened for sexually transmitted infections (STIs) including gonorrhea and chlamydia if: ? You are sexually active and are younger than 45 years of age. ? You are older than 45 years of age and your health care provider tells you that you are at risk for this type of infection. ? Your sexual activity has changed since you were last screened and you are at an increased risk for chlamydia or gonorrhea. Ask your health care provider if you are at risk.  If you do not have HIV, but are at risk, it may be recommended that you take a prescription medicine daily to prevent HIV infection. This is called pre-exposure prophylaxis (PrEP). You are considered at risk  if: ? You are sexually active and do not regularly use condoms or know the HIV status of your partner(s). ? You take drugs by injection. ? You are sexually active with a partner who has HIV.  Talk with your health care provider about whether you are at high risk of being infected with HIV. If you choose to begin PrEP, you should first be tested for HIV. You should then  be tested every 3 months for as long as you are taking PrEP. Pregnancy  If you are premenopausal and you may become pregnant, ask your health care provider about preconception counseling.  If you may become pregnant, take 400 to 800 micrograms (mcg) of folic acid every day.  If you want to prevent pregnancy, talk to your health care provider about birth control (contraception). Osteoporosis and menopause  Osteoporosis is a disease in which the bones lose minerals and strength with aging. This can result in serious bone fractures. Your risk for osteoporosis can be identified using a bone density scan.  If you are 16 years of age or older, or if you are at risk for osteoporosis and fractures, ask your health care provider if you should be screened.  Ask your health care provider whether you should take a calcium or vitamin D supplement to lower your risk for osteoporosis.  Menopause may have certain physical symptoms and risks.  Hormone replacement therapy may reduce some of these symptoms and risks. Talk to your health care provider about whether hormone replacement therapy is right for you. Follow these instructions at home:  Schedule regular health, dental, and eye exams.  Stay current with your immunizations.  Do not use any tobacco products including cigarettes, chewing tobacco, or electronic cigarettes.  If you are pregnant, do not drink alcohol.  If you are breastfeeding, limit how much and how often you drink alcohol.  Limit alcohol intake to no more than 1 drink per day for nonpregnant women. One drink  equals 12 ounces of beer, 5 ounces of wine, or 1 ounces of hard liquor.  Do not use street drugs.  Do not share needles.  Ask your health care provider for help if you need support or information about quitting drugs.  Tell your health care provider if you often feel depressed.  Tell your health care provider if you have ever been abused or do not feel safe at home. This information is not intended to replace advice given to you by your health care provider. Make sure you discuss any questions you have with your health care provider. Document Released: 05/28/2011 Document Revised: 04/19/2016 Document Reviewed: 08/16/2015 Elsevier Interactive Patient Education  [employer].

## 2017-08-29 NOTE — Progress Notes (Signed)
   Subjective:    Patient ID: Rachel Graham, female    DOB: [birth year], 46 y.o.   MRN: [id number]  HPI The patient is a 46 YO female coming in for physical.   PMH, [facility], social history reviewed and updated.   Review of Systems  Constitutional: Negative.   HENT: Negative.   Eyes: Negative.   Respiratory: Negative for cough, chest tightness and shortness of breath.   Cardiovascular: Negative for chest pain, palpitations and leg swelling.  Gastrointestinal: Negative for abdominal distention, abdominal pain, constipation, diarrhea, nausea and vomiting.  Musculoskeletal: Negative.   Skin: Negative.   Neurological: Negative.   Psychiatric/Behavioral: Negative.       Objective:   Physical Exam  Constitutional: She is oriented to person, place, and time. She appears well-developed and well-nourished.  HENT:  Head: Normocephalic and atraumatic.  Eyes: EOM are normal.  Neck: Normal range of motion.  Cardiovascular: Normal rate and regular rhythm.   Pulmonary/Chest: Effort normal and breath sounds normal. No respiratory distress. She has no wheezes. She has no rales.  Abdominal: Soft. Bowel sounds are normal. She exhibits no distension. There is no tenderness. There is no rebound.  Musculoskeletal: She exhibits no edema.  Neurological: She is alert and oriented to person, place, and time. Coordination normal.  Skin: Skin is warm and dry.  Psychiatric: She has a normal mood and affect.   Vitals:   08/29/17 1517  BP: 128/80  Pulse: (!) 57  Temp: 98.2 F (36.8 C)  TempSrc: Oral  SpO2: 99%  Weight: 213 lb (96.6 kg)  Height: 5\' 3"  (1.6 m)      Assessment & Plan:

## 2017-08-30 NOTE — Assessment & Plan Note (Signed)
Flu shot up to date. Tetanus up to date. Colonoscopy at 81, mammogram up to date. Pap smear up to date with gyn. Counseled about sun safety and mole surveillance as well as the dangers of distracted driving. Given screening recommendations.

## 2017-08-30 NOTE — Assessment & Plan Note (Signed)
Counseled about the need to lose weight to help avoid further back pain. She is not exercising currently.

## 2017-08-30 NOTE — Assessment & Plan Note (Signed)
Rx for singulair for better control.

## 2017-09-09 ENCOUNTER — Ambulatory Visit (INDEPENDENT_AMBULATORY_CARE_PROVIDER_SITE_OTHER): Payer: 59 | Admitting: Family Medicine

## 2017-09-09 ENCOUNTER — Encounter: Payer: Self-pay | Admitting: Family Medicine

## 2017-09-09 VITALS — BP 120/80 | HR 71 | Ht 63.0 in | Wt 220.0 lb

## 2017-09-09 DIAGNOSIS — M79605 Pain in left leg: Secondary | ICD-10-CM

## 2017-09-09 DIAGNOSIS — D169 Benign neoplasm of bone and articular cartilage, unspecified: Secondary | ICD-10-CM | POA: Diagnosis not present

## 2017-09-09 DIAGNOSIS — M222X1 Patellofemoral disorders, right knee: Secondary | ICD-10-CM | POA: Insufficient documentation

## 2017-09-09 DIAGNOSIS — M222X2 Patellofemoral disorders, left knee: Secondary | ICD-10-CM

## 2017-09-09 MED ORDER — DICLOFENAC SODIUM 2 % TD SOLN: 2.0000 "application " | Freq: Two times a day (BID) | TRANSDERMAL | 3 refills | Status: DC

## 2017-09-09 NOTE — Progress Notes (Signed)
[name] [place], [place] [address] Phone: [phone] Subjective:    I'm seeing this patient by the request  of:    CC: Bilateral knee pain  [id number]  Rachel Graham is a 46 y.o. female coming in with complaint of bilateral knee pain. Has had this for quite some time. Has been seen previously with some left-sided knee problem. 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 Patient states      Past Medical History:  Diagnosis Date  . Blood in stool   . Contact lens/glasses fitting    wears glases or contacts  . Medical history non-contributory    Past Surgical History:  Procedure Laterality Date  . DIAGNOSTIC LAPAROSCOPY  2010   lt ovarian cyst-LOA  . fibroids surgery  2008   d/c hysteroscopy  . GANGLION CYST EXCISION Right 04/27/2014   Procedure: RIGHT WRIST EXCISION OF MASS ;  Surgeon: Tennis Must, MD;  Location: [facility];  Service: Orthopedics;  Laterality: Right;   Social History   Social History  . Marital status: Married    Spouse name: N/A  . Number of children: N/A  . Years of education: N/A   Social History Main Topics  . Smoking status: Never Smoker  . Smokeless tobacco: Never Used  . Alcohol use Yes     Comment: occasional  . Drug use: No  . Sexual activity: No   Other Topics Concern  . None   Social History Narrative  . None   No Known Allergies Family History  Problem Relation Age of Onset  . Diabetes Mother   . Diabetes Father   . Diabetes Sister   . Diabetes Brother      Past medical history, social, surgical and family history all reviewed in electronic medical record.  No pertanent information unless stated regarding to the chief complaint.   Review of Systems:Review of systems updated and as accurate as of 09/09/17  No headache, visual changes, nausea, vomiting, diarrhea, constipation, dizziness, abdominal pain,  skin rash, fevers, chills, night sweats, weight loss, swollen lymph nodes, body aches, j chest pain, shortness of breath, mood changes. Positive muscle aches and joint swelling  Objective  Blood pressure 120/80, pulse 71, height 5\' 3"  (1.6 m), weight 220 lb (99.8 kg), SpO2 97 %. Systems examined below as of 09/09/17   General: No apparent distress alert and oriented x3 mood and affect normal, dressed appropriately.  HEENT: Pupils equal, extraocular movements intact  Respiratory: Patient's speak in full sentences and does not appear short of breath  Cardiovascular: No lower extremity edema, non tender, no erythema  Skin: Warm dry intact with no signs of infection or rash on extremities or on axial skeleton.  Abdomen: Soft nontender  Neuro: Cranial nerves II through XII are intact, neurovascularly intact in all extremities with 2+ DTRs and 2+ pulses.  Lymph: No lymphadenopathy of posterior or anterior cervical chain or axillae bilaterally.  Gait normal with good balance and coordination.  MSK:  Non tender with full range of motion and good stability and symmetric strength and tone of shoulders, elbows, wrist, hip, and ankles bilaterally.  Knee: Bilateral Mild lateral tilt noted Palpation normal with no warmth, joint line tenderness, patellar tenderness, or condyle tenderness. ROM full in flexion and extension and lower leg rotation. Ligaments with solid consistent endpoints including ACL, PCL, LCL, MCL. Negative Mcmurray's, Apley's, and Thessalonian tests.  painful patellar compression. Patellar glide with moderate crepitus. Patellar and quadriceps tendons unremarkable. Hamstring and quadriceps strength is normal.   [id number]; 15 additional minutes spent for Therapeutic exercises as stated in above notes.  This included exercises focusing on stretching, strengthening, with significant focus on eccentric aspects.   Long term goals include an improvement in range of motion, strength, endurance as  well as avoiding reinjury. Patient's frequency would include in 1-2 times a day, 3-5 times a week for a duration of 6-12 weeks. Patellofemoral Syndrome  Reviewed anatomy using anatomical model and how PFS occurs.  Given rehab exercises handout for VMO, hip abductors, core, entire kinetic chain including proprioception exercises including cone touches, step downs, hip elevations and turn outs.  Could benefit from PT, regular exercise, upright biking, and a PFS knee brace to assist with tracking abnormalities.   Proper technique shown and discussed handout in great detail with ATC.  All questions were discussed and answered.     Impression and Recommendations:     This case required medical decision making of moderate complexity.      Note: This dictation was prepared with Dragon dictation along with smaller phrase technology. Any transcriptional errors that result from this process are unintentional.

## 2017-09-09 NOTE — Assessment & Plan Note (Signed)
Discussed with patient again at great length. Patient will have repeat x-rays done today. Further evaluated and x-rayed no significant growth. No systemic findings.

## 2017-09-09 NOTE — Patient Instructions (Signed)
Good to see you  We will get xray downstairs just to make sure no change.  Ice is your friend Ice 20 minutes 2 times daily. Usually after activity and before bed. Zumba 3 times a day max and wear the brace  OK to do treadmill but wear the brace Exercises 3 times a week.  pennsaid pinkie amount topically 2 times daily as needed.   See me again in 4 weeks.

## 2017-09-09 NOTE — Assessment & Plan Note (Signed)
Bilateral patellofemoral syndrome. Encourage weight loss, proper shoes, hip abductor strengthening. Work with Product/process development scientist to learn home exercises. Topical anti-inflammatory prescribed. Discussed icing regimen, patient will try these different changes and come back and see me again in 4-6 weeks.

## 2017-12-18 MED FILL — busPIRone HCL 5 MG TABS: 5 | 30 days supply | Qty: 60 | Fill #1

## 2018-04-08 DIAGNOSIS — N911 Secondary amenorrhea: Secondary | ICD-10-CM | POA: Diagnosis not present

## 2018-09-30 DIAGNOSIS — H52223 Regular astigmatism, bilateral: Secondary | ICD-10-CM | POA: Diagnosis not present

## 2018-10-29 DIAGNOSIS — Z1151 Encounter for screening for human papillomavirus (HPV): Secondary | ICD-10-CM | POA: Diagnosis not present

## 2018-10-29 DIAGNOSIS — Z1231 Encounter for screening mammogram for malignant neoplasm of breast: Secondary | ICD-10-CM | POA: Diagnosis not present

## 2018-10-29 DIAGNOSIS — Z6838 Body mass index (BMI) 38.0-38.9, adult: Secondary | ICD-10-CM | POA: Diagnosis not present

## 2018-10-29 DIAGNOSIS — Z01419 Encounter for gynecological examination (general) (routine) without abnormal findings: Secondary | ICD-10-CM | POA: Diagnosis not present

## 2018-10-29 LAB — HM MAMMOGRAPHY

## 2018-10-29 LAB — HM PAP SMEAR

## 2019-01-20 ENCOUNTER — Encounter: Payer: Self-pay | Admitting: Internal Medicine

## 2019-01-20 ENCOUNTER — Ambulatory Visit: Payer: 59 | Admitting: Internal Medicine

## 2019-01-20 VITALS — BP 110/72 | HR 68 | Temp 97.8°F | Ht 63.0 in | Wt 224.0 lb

## 2019-01-20 DIAGNOSIS — B9789 Other viral agents as the cause of diseases classified elsewhere: Secondary | ICD-10-CM | POA: Diagnosis not present

## 2019-01-20 DIAGNOSIS — J3089 Other allergic rhinitis: Secondary | ICD-10-CM | POA: Diagnosis not present

## 2019-01-20 DIAGNOSIS — N959 Unspecified menopausal and perimenopausal disorder: Secondary | ICD-10-CM | POA: Diagnosis not present

## 2019-01-20 DIAGNOSIS — J069 Acute upper respiratory infection, unspecified: Secondary | ICD-10-CM | POA: Insufficient documentation

## 2019-01-20 MED ORDER — FLUTICASONE PROPIONATE 50 MCG/ACT NA SUSP: 2.0000 | Freq: Every day | NASAL | 6 refills | Status: DC

## 2019-01-20 MED ORDER — ESCITALOPRAM OXALATE 10 MG PO TABS: 10.0000 mg | ORAL_TABLET | Freq: Every day | ORAL | 1 refills | Status: DC

## 2019-01-20 MED ORDER — MONTELUKAST SODIUM 10 MG PO TABS: 10.0000 mg | ORAL_TABLET | Freq: Every day | ORAL | 3 refills | Status: DC

## 2019-01-20 MED FILL — ESCITALOPRAM 10 MG TABLET: 10 | 90 days supply | Qty: 90 | Fill #0

## 2019-01-20 MED FILL — MONTELUKAST SOD 10 MG TAB: 10 | 90 days supply | Qty: 90 | Fill #0

## 2019-01-20 MED FILL — FLUTICASONE PROP 50 MCG SPR: 50 | 30 days supply | Qty: 16 | Fill #0

## 2019-01-20 NOTE — Patient Instructions (Signed)
We have sent in the lexapro to try daily for the hot flashes.   We have refilled the singulair and flonase for allergies.

## 2019-01-20 NOTE — Progress Notes (Signed)
   Subjective:   Patient ID: Rachel Graham, female    DOB: 24-Jun-1971, 48 y.o.   MRN: [id number]  HPI The patient is a 48 YO female coming in for several concerns including: cold symptoms (started 4 days ago, main symptoms are: sinus pressure, headaches, nasal drainage, denies fevers or chills, denies known sick contacts, did get flu vaccine, overall it is improving with sudafed sinus, is out of singulair and flonase due to no visit in 1-2 years), and sinuses (needs refill of singulair and flonase which she uses seasonally, cannot afford flonase otc, has not tried zyrtec or claritin recently, denies fevers or chills), and menopause symptoms (having hot flashes, has not tried anything for them, denies weight loss, night sweats and needing to change clothes after awaking, last period 2018).    Review of Systems  Constitutional: Positive for activity change, appetite change and chills. Negative for fatigue, fever and unexpected weight change.  HENT: Positive for congestion, postnasal drip, rhinorrhea and sinus pressure. Negative for ear discharge, ear pain, sinus pain, sneezing, sore throat, tinnitus, trouble swallowing and voice change.   Eyes: Negative.   Respiratory: Positive for cough. Negative for chest tightness, shortness of breath and wheezing.   Cardiovascular: Negative.  Negative for chest pain, palpitations and leg swelling.  Gastrointestinal: Negative.  Negative for abdominal distention, abdominal pain, constipation, diarrhea, nausea and vomiting.  Endocrine: Positive for heat intolerance.  Musculoskeletal: Positive for myalgias.  Skin: Negative.   Neurological: Negative.   Psychiatric/Behavioral: Negative.     Objective:  Physical Exam Constitutional:      Appearance: She is well-developed.  HENT:     Head: Normocephalic and atraumatic.     Comments: Oropharynx with redness and clear drainage, nose with swollen turbinates, TMs normal bilaterally.  Neck:   Musculoskeletal: Normal range of motion.     Thyroid: No thyromegaly.  Cardiovascular:     Rate and Rhythm: Normal rate and regular rhythm.  Pulmonary:     Effort: Pulmonary effort is normal. No respiratory distress.     Breath sounds: Normal breath sounds. No wheezing or rales.  Abdominal:     General: Bowel sounds are normal. There is no distension.     Palpations: Abdomen is soft.     Tenderness: There is no abdominal tenderness. There is no rebound.  Musculoskeletal:        General: Tenderness present.  Lymphadenopathy:     Cervical: No cervical adenopathy.  Skin:    General: Skin is warm and dry.  Neurological:     Mental Status: She is alert and oriented to person, place, and time.     Coordination: Coordination normal.     Vitals:   01/20/19 1529  BP: 110/72  Pulse: 68  Temp: 97.8 F (36.6 C)  TempSrc: Oral  SpO2: 97%  Weight: 224 lb (101.6 kg)  Height: 5\' 3"  (1.6 m)    Assessment & Plan:

## 2019-01-20 NOTE — Assessment & Plan Note (Signed)
Outside window for tamiflu and this is not typical for flu by symptoms. Suspect more URI or sinus related. Rx for flonase and singulair and can safely continue sudafed as needed. Continue supportive care.

## 2019-01-20 NOTE — Assessment & Plan Note (Signed)
Rx for lexapro for hot flashes. Talked to her about otc supplements to try as well. We talked about the risks of hormonal therapy and how this is not first line treatment.

## 2019-01-20 NOTE — Assessment & Plan Note (Signed)
Rx for flonase and singulair. No sinus infection today.

## 2019-01-23 ENCOUNTER — Encounter: Payer: Self-pay | Admitting: Internal Medicine

## 2019-05-18 ENCOUNTER — Other Ambulatory Visit: Payer: Self-pay | Admitting: *Deleted

## 2019-05-18 DIAGNOSIS — Z20822 Contact with and (suspected) exposure to covid-19: Secondary | ICD-10-CM

## 2019-05-21 NOTE — Addendum Note (Signed)
Addended by: Brigitte Pulse on: 05/21/2019 04:27 PM   Modules accepted: Orders

## 2019-06-14 ENCOUNTER — Encounter: Payer: Self-pay | Admitting: Emergency Medicine

## 2019-06-14 ENCOUNTER — Emergency Department (HOSPITAL_COMMUNITY)
Admission: EM | Admit: 2019-06-14 | Discharge: 2019-06-14 | Disposition: A | Discharge status: Home / Self Care | Payer: 59 | Attending: Emergency Medicine | Admitting: Emergency Medicine

## 2019-06-14 DIAGNOSIS — T7840XA Allergy, unspecified, initial encounter: Secondary | ICD-10-CM | POA: Insufficient documentation

## 2019-06-14 DIAGNOSIS — L299 Pruritus, unspecified: Secondary | ICD-10-CM | POA: Diagnosis present

## 2019-06-14 MED ORDER — METHYLPREDNISOLONE SODIUM SUCC 125 MG IJ SOLR
125.0000 mg | Freq: Once | INTRAMUSCULAR | Status: AC
  Administered 2019-06-14: 125 mg via INTRAVENOUS
  Filled 2019-06-14: qty 2

## 2019-06-14 MED ORDER — DIPHENHYDRAMINE HCL 50 MG/ML IJ SOLN
50.0000 mg | Freq: Once | INTRAMUSCULAR | Status: AC
  Administered 2019-06-14: 50 mg via INTRAVENOUS
  Filled 2019-06-14: qty 1

## 2019-06-14 MED ORDER — FAMOTIDINE IN NACL 20-0.9 MG/50ML-% IV SOLN
20.0000 mg | Freq: Once | INTRAVENOUS | Status: AC
  Administered 2019-06-14: 20 mg via INTRAVENOUS
  Filled 2019-06-14: qty 50

## 2019-06-14 NOTE — Discharge Instructions (Signed)
Please continue taking Benadryl daily  Follow up with your PCP Return to the ED for any worsening symptoms

## 2019-06-14 NOTE — ED Provider Notes (Signed)
[facility] EMERGENCY DEPARTMENT Provider Note   CSN: [id number] Arrival date & time: 06/14/19  1150    History   Chief Complaint Chief Complaint  Patient presents with  . Reaction to hair products    HPI Rachel Graham is a 48 y.o. female who presents to the ED today complaining of itching to scalp and facial swelling x 2 days. Pt reports that on Thursday she had her hair down and had crotchet hair put in. She woke up Friday AM with itching diffusely to her scalp. Pt called her hair salon and was told to take the crochet  Hair out immediately. She went back to the salon and had her hair washed and went home. Pt has been trying home remedies including putting oil and lime juice on her head and has also been taking Benadryl PO. She reports she woke up this morning with diffuse swelling around her eyes, prompting her to come to the ED. Denies lip swelling, tongue swelling, throat swelling, shortness of breath.        Past Medical History:  Diagnosis Date  . Blood in stool   . Contact lens/glasses fitting    wears glases or contacts  . Medical history non-contributory     Patient Active Problem List   Diagnosis Date Noted  . Postmenopausal symptoms 01/20/2019  . Viral URI with cough 01/20/2019  . Patellofemoral syndrome of both knees 09/09/2017  . Osteochondroma 05/31/2015  . Allergic rhinitis 03/24/2015  . Obesity 02/25/2015  . Routine general medical examination at a health care facility 02/25/2015    Past Surgical History:  Procedure Laterality Date  . DIAGNOSTIC LAPAROSCOPY  2010   lt ovarian cyst-LOA  . fibroids surgery  2008   d/c hysteroscopy  . GANGLION CYST EXCISION Right 04/27/2014   Procedure: RIGHT WRIST EXCISION OF MASS ;  Surgeon: Tennis Must, MD;  Location: [facility];  Service: Orthopedics;  Laterality: Right;     OB History   No obstetric history on file.      Home Medications    Prior to Admission  medications   Medication Sig Start Date End Date Taking? Authorizing Provider  busPIRone (BUSPAR) 5 MG tablet Take 1 tablet (5 mg total) by mouth 2 (two) times daily. 08/29/17   Hoyt Koch, MD  Diclofenac Sodium (PENNSAID) 2 % SOLN Place 2 application onto the skin 2 (two) times daily. 09/09/17   Lyndal Pulley, DO  escitalopram (LEXAPRO) 10 MG tablet Take 1 tablet (10 mg total) by mouth daily. 01/20/19   Hoyt Koch, MD  fluticasone Asencion Islam) 50 MCG/ACT nasal spray Place 2 sprays into both nostrils daily. 01/20/19   Hoyt Koch, MD  montelukast (SINGULAIR) 10 MG tablet Take 1 tablet (10 mg total) by mouth at bedtime. 01/20/19   Hoyt Koch, MD    Family History Family History  Problem Relation Age of Onset  . Diabetes Mother   . Diabetes Father   . Diabetes Sister   . Diabetes Brother     Social History Social History   Tobacco Use  . Smoking status: Never Smoker  . Smokeless tobacco: Never Used  Substance Use Topics  . Alcohol use: Yes    Comment: occasional  . Drug use: No     Allergies   Patient has no known allergies.   Review of Systems Review of Systems  Constitutional: Negative for chills and fever.  HENT: Positive for facial swelling. Negative for  sore throat and trouble swallowing.   Eyes: Positive for itching. Negative for pain.  Respiratory: Negative for cough and shortness of breath.   Cardiovascular: Negative for chest pain and palpitations.  Gastrointestinal: Negative for nausea and vomiting.  Genitourinary: Negative for difficulty urinating.  Musculoskeletal: Negative for myalgias.  Skin: Positive for rash.  Neurological: Negative for headaches.     Physical Exam Updated Vital Signs BP 139/86 (BP Location: Right Arm)   Pulse 70   Temp 98.4 F (36.9 C) (Oral)   Resp 18   Ht 5\' 3"  (1.6 m)   Wt 97.5 kg   LMP 11/27/2016   SpO2 98%   BMI 38.09 kg/m   Physical Exam Vitals signs and nursing note reviewed.   Constitutional:      Appearance: She is not ill-appearing.  HENT:     Head: Normocephalic and atraumatic.     Comments: No maxillary or frontal sinus TTP Eyes:     Extraocular Movements: Extraocular movements intact.     Conjunctiva/sclera: Conjunctivae normal.     Pupils: Pupils are equal, round, and reactive to light.     Comments: Diffuse swelling noted to periorbital areas bilaterally. No pain with EOM.   Neck:     Musculoskeletal: Neck supple.  Cardiovascular:     Rate and Rhythm: Normal rate and regular rhythm.  Pulmonary:     Effort: Pulmonary effort is normal.     Breath sounds: Normal breath sounds.  Abdominal:     Palpations: Abdomen is soft.     Tenderness: There is no abdominal tenderness.  Skin:    General: Skin is warm and dry.     Findings: Rash present.     Comments: Maculopapular rash noted to pt's hairline  Neurological:     Mental Status: She is alert.      ED Treatments / Results  Labs (all labs ordered are listed, but only abnormal results are displayed) Labs Reviewed - No data to display  EKG None  Radiology No results found.  Procedures Procedures (including critical care time)  Medications Ordered in ED Medications  famotidine (PEPCID) IVPB 20 mg premix (0 mg Intravenous Stopped 06/14/19 1308)  diphenhydrAMINE (BENADRYL) injection 50 mg (50 mg Intravenous Given 06/14/19 1229)  methylPREDNISolone sodium succinate (SOLU-MEDROL) 125 mg/2 mL injection 125 mg (125 mg Intravenous Given 06/14/19 1228)     Initial Impression / Assessment and Plan / ED Course  I have reviewed the triage vital signs and the nursing notes.  Pertinent labs & imaging results that were available during my care of the patient were reviewed by me and considered in my medical decision making (see chart for details).    48 year old female presenting with itching and bilateral eyelid swelling after getting hair done 2 days ago. Has been taking Benadryl without relief. No  signs of airway involvement today. Speaking in full sentences. Satting 100% on RA. No lip swelling. Uvula midline and airway patent. Will give IV pepcid, benadryl, and solumedrol today and reevaluate.   After a couple of hours in the ED pt reports her symptoms have improved significantly. She feels that her eyelid swelling has improved as well. Advised to follow up with PCP and to continue taking Benadryl daily. Strict return precautions discussed. Pt is in agreement with plan at this time and stable for discharge home.        Final Clinical Impressions(s) / ED Diagnoses   Final diagnoses:  Allergic reaction, initial encounter    ED  Discharge Orders    None       Eustaquio Maize, PA-C 06/14/19 1816    Daleen Bo, MD 06/15/19 [phone]

## 2019-06-14 NOTE — ED Notes (Signed)
Patient verbalizes understanding of discharge instructions. Opportunity for questioning and answers were provided. Armband removed by staff, pt discharged from ED.  

## 2019-06-14 NOTE — ED Triage Notes (Signed)
Pt states she had a reaction to hair products that were put in on 06/11/19.  She took the hair pieces out of her hair on 06/13/19.  Pt c/o swelling to face.  Denies swelling of tongue, shortness of breath, or difficulty breathing.  Redness and rash noted to edges of scalp and forehead

## 2019-06-16 ENCOUNTER — Ambulatory Visit (INDEPENDENT_AMBULATORY_CARE_PROVIDER_SITE_OTHER): Payer: 59 | Admitting: Internal Medicine

## 2019-06-16 ENCOUNTER — Other Ambulatory Visit: Payer: Self-pay

## 2019-06-16 ENCOUNTER — Encounter: Payer: Self-pay | Admitting: Internal Medicine

## 2019-06-16 VITALS — BP 114/78 | HR 73 | Temp 98.2°F | Ht 63.0 in | Wt 226.0 lb

## 2019-06-16 DIAGNOSIS — T7840XA Allergy, unspecified, initial encounter: Secondary | ICD-10-CM | POA: Diagnosis not present

## 2019-06-16 DIAGNOSIS — R739 Hyperglycemia, unspecified: Secondary | ICD-10-CM | POA: Diagnosis not present

## 2019-06-16 DIAGNOSIS — T7840XD Allergy, unspecified, subsequent encounter: Secondary | ICD-10-CM

## 2019-06-16 DIAGNOSIS — J3089 Other allergic rhinitis: Secondary | ICD-10-CM | POA: Diagnosis not present

## 2019-06-16 MED ORDER — METHYLPREDNISOLONE ACETATE 80 MG/ML IJ SUSP
80.0000 mg | Freq: Once | INTRAMUSCULAR | Status: AC
  Administered 2019-06-16: 80 mg via INTRAMUSCULAR

## 2019-06-16 NOTE — Progress Notes (Signed)
Subjective:    Patient ID: Rachel Graham, female    DOB: 1971-06-17, 48 y.o.   MRN: [id number]  HPI    Here after remarkable scalp skin reaction to hair treatment as extremely well detailed in ED note of July 17.  Treated with pepcid, benadryl, solumedrol, and no meds at d/c, except for contd oral bendaryl.  Overall much improved with swelling and burning but itching still significant.  She is much relieved but itching still makes her remarkably edgy. Facial swelling near resolved, no never had tongue swelling  Pt denies chest pain, increased sob or doe, wheezing, orthopnea, PND, increased LE swelling, palpitations, dizziness or syncope.    No fever   Pt denies polydipsia, polyuria  No other new complaints Past Medical History:  Diagnosis Date  . Blood in stool   . Contact lens/glasses fitting    wears glases or contacts  . Medical history non-contributory    Past Surgical History:  Procedure Laterality Date  . DIAGNOSTIC LAPAROSCOPY  2010   lt ovarian cyst-LOA  . fibroids surgery  2008   d/c hysteroscopy  . GANGLION CYST EXCISION Right 04/27/2014   Procedure: RIGHT WRIST EXCISION OF MASS ;  Surgeon: Tennis Must, MD;  Location: [facility];  Service: Orthopedics;  Laterality: Right;    reports that she has never smoked. She has never used smokeless tobacco. She reports current alcohol use. She reports that she does not use drugs. family history includes Diabetes in her brother, father, mother, and sister. No Known Allergies Current Outpatient Medications on File Prior to Visit  Medication Sig Dispense Refill  . busPIRone (BUSPAR) 5 MG tablet Take 1 tablet (5 mg total) by mouth 2 (two) times daily. 60 tablet 6  . Diclofenac Sodium (PENNSAID) 2 % SOLN Place 2 application onto the skin 2 (two) times daily. 112 g 3  . escitalopram (LEXAPRO) 10 MG tablet Take 1 tablet (10 mg total) by mouth daily. 90 tablet 1  . fluticasone (FLONASE) 50 MCG/ACT nasal spray Place 2  sprays into both nostrils daily. 16 g 6  . montelukast (SINGULAIR) 10 MG tablet Take 1 tablet (10 mg total) by mouth at bedtime. 90 tablet 3   No current facility-administered medications on file prior to visit.    Review of Systems  Constitutional: Negative for other unusual diaphoresis or sweats HENT: Negative for ear discharge or swelling Eyes: Negative for other worsening visual disturbances Respiratory: Negative for stridor or other swelling  Gastrointestinal: Negative for worsening distension or other blood Genitourinary: Negative for retention or other urinary change Musculoskeletal: Negative for other MSK pain or swelling Skin: Negative for color change or other new lesions Neurological: Negative for worsening tremors and other numbness  Psychiatric/Behavioral: Negative for worsening agitation or other fatigue All other system neg per pt    Objective:   Physical Exam BP 114/78   Pulse 73   Temp 98.2 F (36.8 C) (Oral)   Ht 5\' 3"  (1.6 m)   Wt 226 lb (102.5 kg)   LMP 11/27/2016   SpO2 99%   BMI 40.03 kg/m  VS noted,  Constitutional: Pt appears in NAD HENT: Head: NCAT.  Right Ear: External ear normal.  Left Ear: External ear normal.  Eyes: . Pupils are equal, round, and reactive to light. Conjunctivae and EOM are normal Nose: without d/c or deformity Neck: Neck supple. Gross normal ROM Cardiovascular: Normal rate and regular rhythm.   Pulmonary/Chest: Effort normal and breath sounds without rales  or wheezing.  Abd:  Soft, NT, ND, + BS, no organomegaly Neurological: Pt is alert. At baseline orientation, motor grossly intact Skin: Skin is warm. No rashes, other new lesions, no LE edema Psychiatric: Pt behavior is normal without agitation  No other exam findings Lab Results  Component Value Date   WBC 6.6 08/29/2017   HGB 11.8 (L) 08/29/2017   HCT 36.5 08/29/2017   PLT 270.0 08/29/2017   GLUCOSE 123 (H) 08/29/2017   CHOL 191 08/29/2017   TRIG 38.0 08/29/2017    HDL 62.00 08/29/2017   LDLCALC 122 (H) 08/29/2017   ALT 12 08/29/2017   AST 11 08/29/2017   NA 139 08/29/2017   K 4.3 08/29/2017   CL 105 08/29/2017   CREATININE 0.96 08/29/2017   BUN 16 08/29/2017   CO2 29 08/29/2017   TSH 0.59 08/29/2017   HGBA1C 5.7 08/29/2017        Assessment & Plan:

## 2019-06-16 NOTE — Patient Instructions (Signed)
You had the steroid shot today  OK to continue the benadryl as you have  Please continue all other medications as before, and refills have been done if requested.  Please have the pharmacy call with any other refills you may need.  Please keep your appointments with your specialists as you may have planned

## 2019-06-16 NOTE — Assessment & Plan Note (Signed)
Much improved , ok for depomedrol IM 80 today to continue the process further, does not appear to need predpac, and ok for contd benadryl oral prn

## 2019-06-16 NOTE — Assessment & Plan Note (Signed)
stable overall by history and exam, recent data reviewed with pt, and pt to continue medical treatment as before,  to f/u any worsening symptoms or concerns  

## 2019-06-16 NOTE — Assessment & Plan Note (Signed)
Also to improved with tx,  to f/u any worsening symptoms or concerns

## 2019-11-16 ENCOUNTER — Other Ambulatory Visit: Payer: Self-pay

## 2019-11-16 ENCOUNTER — Emergency Department (HOSPITAL_COMMUNITY)
Admission: EM | Admit: 2019-11-16 | Discharge: 2019-11-16 | Disposition: A | Discharge status: Home / Self Care | Payer: 59 | Attending: Emergency Medicine | Admitting: Emergency Medicine

## 2019-11-16 ENCOUNTER — Encounter (HOSPITAL_COMMUNITY): Payer: Self-pay | Admitting: Emergency Medicine

## 2019-11-16 DIAGNOSIS — M5442 Lumbago with sciatica, left side: Secondary | ICD-10-CM | POA: Insufficient documentation

## 2019-11-16 DIAGNOSIS — Z79899 Other long term (current) drug therapy: Secondary | ICD-10-CM | POA: Diagnosis not present

## 2019-11-16 DIAGNOSIS — M545 Low back pain: Secondary | ICD-10-CM | POA: Diagnosis present

## 2019-11-16 MED ORDER — METHOCARBAMOL 500 MG PO TABS: 500.0000 mg | ORAL_TABLET | Freq: Two times a day (BID) | ORAL | 0 refills | Status: AC

## 2019-11-16 MED ORDER — PREDNISONE 20 MG PO TABS: 40.0000 mg | ORAL_TABLET | Freq: Every day | ORAL | 0 refills | Status: AC
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
## 2019-11-16 NOTE — Discharge Instructions (Addendum)
I have prescribed medication to help treat your back pain, please take this as directed.  Please be aware muscle relaxers can cause drowsiness, do not drink alcohol or drive or taking this medication. I have also prescribed steroids, please be aware this medication can cause insomnia, appetite changes, flushness.  If you experience any bowel or bladder incontinence, fevers, worsening symptoms please return to the emergency department.
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
## 2019-12-01 ENCOUNTER — Emergency Department (HOSPITAL_COMMUNITY): Payer: 59

## 2019-12-01 ENCOUNTER — Encounter (HOSPITAL_COMMUNITY): Payer: Self-pay

## 2019-12-01 ENCOUNTER — Emergency Department (HOSPITAL_COMMUNITY)
Admission: EM | Admit: 2019-12-01 | Discharge: 2019-12-02 | Disposition: A | Discharge status: Home / Self Care | Payer: 59 | Attending: Emergency Medicine | Admitting: Emergency Medicine

## 2019-12-01 DIAGNOSIS — R52 Pain, unspecified: Secondary | ICD-10-CM | POA: Diagnosis not present

## 2019-12-01 DIAGNOSIS — R609 Edema, unspecified: Secondary | ICD-10-CM | POA: Diagnosis not present

## 2019-12-01 DIAGNOSIS — Y929 Unspecified place or not applicable: Secondary | ICD-10-CM | POA: Diagnosis not present

## 2019-12-01 DIAGNOSIS — W010XXA Fall on same level from slipping, tripping and stumbling without subsequent striking against object, initial encounter: Secondary | ICD-10-CM | POA: Insufficient documentation

## 2019-12-01 DIAGNOSIS — S82842D Displaced bimalleolar fracture of left lower leg, subsequent encounter for closed fracture with routine healing: Secondary | ICD-10-CM | POA: Diagnosis not present

## 2019-12-01 DIAGNOSIS — S82842A Displaced bimalleolar fracture of left lower leg, initial encounter for closed fracture: Secondary | ICD-10-CM | POA: Diagnosis not present

## 2019-12-01 DIAGNOSIS — Y939 Activity, unspecified: Secondary | ICD-10-CM | POA: Insufficient documentation

## 2019-12-01 DIAGNOSIS — Y999 Unspecified external cause status: Secondary | ICD-10-CM | POA: Diagnosis not present

## 2019-12-01 DIAGNOSIS — S8262XA Displaced fracture of lateral malleolus of left fibula, initial encounter for closed fracture: Secondary | ICD-10-CM | POA: Diagnosis not present

## 2019-12-01 DIAGNOSIS — S8252XA Displaced fracture of medial malleolus of left tibia, initial encounter for closed fracture: Secondary | ICD-10-CM | POA: Diagnosis not present

## 2019-12-01 DIAGNOSIS — Z8781 Personal history of (healed) traumatic fracture: Secondary | ICD-10-CM

## 2019-12-01 DIAGNOSIS — S99912A Unspecified injury of left ankle, initial encounter: Secondary | ICD-10-CM | POA: Diagnosis present

## 2019-12-01 MED ORDER — PROPOFOL 10 MG/ML IV BOLUS
0.5000 mg/kg | Freq: Once | INTRAVENOUS | Status: DC
  Filled 2019-12-01: qty 20

## 2019-12-01 MED ORDER — HYDROCODONE-ACETAMINOPHEN 5-325 MG PO TABS: 1.0000 | ORAL_TABLET | Freq: Four times a day (QID) | ORAL | 0 refills | Status: DC | PRN

## 2019-12-01 MED ORDER — PROPOFOL 10 MG/ML IV BOLUS
INTRAVENOUS | Status: AC | PRN
  Administered 2019-12-01: 5330 ug via INTRAVENOUS
  Administered 2019-12-01 (×2): 3198 ug via INTRAVENOUS

## 2019-12-01 MED ORDER — FENTANYL CITRATE (PF) 100 MCG/2ML IJ SOLN
INTRAMUSCULAR | Status: AC | PRN
  Administered 2019-12-01: 50 ug via INTRAVENOUS

## 2019-12-01 MED ORDER — OXYCODONE-ACETAMINOPHEN 5-325 MG PO TABS
1.0000 | ORAL_TABLET | ORAL | Status: DC | PRN
  Filled 2019-12-01 (×2): qty 1

## 2019-12-01 MED ORDER — FENTANYL CITRATE (PF) 100 MCG/2ML IJ SOLN
50.0000 ug | Freq: Once | INTRAMUSCULAR | Status: DC
  Filled 2019-12-01: qty 2

## 2019-12-01 NOTE — Sedation Documentation (Signed)
Splint applied by ortho and crutches adjusted to height

## 2019-12-01 NOTE — ED Notes (Signed)
Pt placed on cardiac monitoring and in gown. Pt has CO2 monitoring on as well. Respiratory and ortho called to bedside.

## 2019-12-01 NOTE — ED Provider Notes (Signed)
[facility] DEPT Provider Note   CSN: [id number] Arrival date & time: 12/01/19  1932     History Chief Complaint  Patient presents with  . Fall  . Ankle Pain    Rachel Graham is a 49 y.o. female.  HPI Patient presents with left ankle injury.  States that she tripped over a water bottle earlier today and twisted her left ankle.  Pain is severe.  No other injury.  Not on anticoagulation.  No numbness or weakness.  Last ate at lunch today.  No head or neck pain.    Past Medical History:  Diagnosis Date  . Blood in stool   . Contact lens/glasses fitting    wears glases or contacts  . Medical history non-contributory     Patient Active Problem List   Diagnosis Date Noted  . Allergic reaction 06/16/2019  . Hyperglycemia 06/16/2019  . Postmenopausal symptoms 01/20/2019  . Viral URI with cough 01/20/2019  . Patellofemoral syndrome of both knees 09/09/2017  . Osteochondroma 05/31/2015  . Allergic rhinitis 03/24/2015  . Obesity 02/25/2015  . Routine general medical examination at a health care facility 02/25/2015    Past Surgical History:  Procedure Laterality Date  . DIAGNOSTIC LAPAROSCOPY  2010   lt ovarian cyst-LOA  . fibroids surgery  2008   d/c hysteroscopy  . GANGLION CYST EXCISION Right 04/27/2014   Procedure: RIGHT WRIST EXCISION OF MASS ;  Surgeon: Tennis Must, MD;  Location: [place];  Service: Orthopedics;  Laterality: Right;     OB History   No obstetric history on file.     Family History  Problem Relation Age of Onset  . Diabetes Mother   . Diabetes Father   . Diabetes Sister   . Diabetes Brother     Social History   Tobacco Use  . Smoking status: Never Smoker  . Smokeless tobacco: Never Used  Substance Use Topics  . Alcohol use: Yes    Comment: occasional  . Drug use: No    Home Medications Prior to Admission medications   Medication Sig Start Date End Date Taking? Authorizing  Provider  busPIRone (BUSPAR) 5 MG tablet Take 1 tablet (5 mg total) by mouth 2 (two) times daily. Patient not taking: Reported on 12/01/2019 08/29/17   Hoyt Koch, MD  Diclofenac Sodium (PENNSAID) 2 % SOLN Place 2 application onto the skin 2 (two) times daily. Patient not taking: Reported on 12/01/2019 09/09/17   Lyndal Pulley, DO  escitalopram (LEXAPRO) 10 MG tablet Take 1 tablet (10 mg total) by mouth daily. Patient not taking: Reported on 12/01/2019 01/20/19   Hoyt Koch, MD  fluticasone [facility]) 50 MCG/ACT nasal spray Place 2 sprays into both nostrils daily. Patient not taking: Reported on 12/01/2019 01/20/19   Hoyt Koch, MD  HYDROcodone-acetaminophen (NORCO/VICODIN) 5-325 MG tablet Take 1-2 tablets by mouth every 6 (six) hours as needed. 12/01/19   Davonna Belling, MD  montelukast (SINGULAIR) 10 MG tablet Take 1 tablet (10 mg total) by mouth at bedtime. Patient not taking: Reported on 12/01/2019 01/20/19   Hoyt Koch, MD    Allergies    Patient has no known allergies.  Review of Systems   Review of Systems  Constitutional: Negative for appetite change.  HENT: Negative for congestion.   Respiratory: Negative for shortness of breath.   Gastrointestinal: Negative for abdominal pain.  Genitourinary: Negative for frequency.  Musculoskeletal:       Left ankle  pain and deformity.  Skin: Negative for rash and wound.  Neurological: Negative for weakness and numbness.    Physical Exam Updated Vital Signs BP (!) 144/88   Pulse 74   Temp 99.2 F (37.3 C) (Oral)   Resp 18   Wt 106.6 kg   LMP 11/27/2016   SpO2 100%   BMI 41.63 kg/m   Physical Exam Vitals reviewed.  HENT:     Head: Atraumatic.  Eyes:     Pupils: Pupils are equal, round, and reactive to light.  Cardiovascular:     Rate and Rhythm: Regular rhythm.  Abdominal:     Tenderness: There is no abdominal tenderness.  Musculoskeletal:        General: Tenderness present.      Comments: Tenderness and deformity of left ankle.  Skin intact.  No tenderness over proximal fibula.  Knee and hip intact also.  Sensation intact over foot.  Skin:    General: Skin is warm.     Capillary Refill: Capillary refill takes less than 2 seconds.  Neurological:     Mental Status: She is alert. Mental status is at baseline.     ED Results / Procedures / Treatments   Labs (all labs ordered are listed, but only abnormal results are displayed) Labs Reviewed - No data to display  EKG None  Radiology DG Ankle Complete Left  Result Date: 12/01/2019 CLINICAL DATA:  Fall EXAM: LEFT ANKLE COMPLETE - 3+ VIEW COMPARISON:  None. FINDINGS: Mildly laterally displaced fracture of the medial malleolus, in transverse orientation. Mildly posteriorly displaced oblique fracture of the distal fibula. There is lateral subluxation of the talus relative to the tibia. IMPRESSION: By malleolar left ankle fracture with mild lateral subluxation at the tibiotalar joint. Electronically Signed   By: Ulyses Jarred M.D.   On: 12/01/2019 20:10   DG Ankle Left Port  Result Date: 12/01/2019 CLINICAL DATA:  Status post reduction EXAM: PORTABLE LEFT ANKLE - 2 VIEW COMPARISON:  12/01/2019 FINDINGS: Casting material is noted. There has been interval reduction. The fracture fragments are in near anatomic alignment. IMPRESSION: Reduction of bimalleolar fracture when compared with the prior exam. Electronically Signed   By: Inez Catalina M.D.   On: 12/01/2019 23:23    Procedures Sedation procedure  Date/Time: 12/01/2019 11:26 PM Performed by: Davonna Belling, MD Authorized by: Davonna Belling, MD   Consent:    Consent obtained:  Verbal   Consent given by:  Patient   Risks discussed:  Allergic reaction, dysrhythmia, inadequate sedation, nausea, vomiting, respiratory compromise necessitating ventilatory assistance and intubation and prolonged hypoxia resulting in organ damage   Alternatives discussed:  Analgesia  without sedation Universal protocol:    Immediately prior to procedure a time out was called: yes     Patient identity confirmation method:  Arm band and verbally with patient Indications:    Procedure performed:  Fracture reduction   Procedure necessitating sedation performed by:  Physician performing sedation Pre-sedation assessment:    Time since last food or drink:  10 hrs   ASA classification: class 2 - patient with mild systemic disease     Neck mobility: normal     Mouth opening:  1 finger width   Thyromental distance:  4 finger widths   Mallampati score:  III - soft palate, base of uvula visible   Pre-sedation assessments completed and reviewed: airway patency, cardiovascular function, hydration status, mental status, pain level, respiratory function and temperature     Pre-sedation assessments completed and reviewed:  pre-procedure nausea and vomiting status not reviewed   Immediate pre-procedure details:    Reassessment: Patient reassessed immediately prior to procedure     Reviewed: vital signs, relevant labs/tests and NPO status     Verified: bag valve mask available, emergency equipment available, intubation equipment available, IV patency confirmed and oxygen available   Procedure details (see MAR for exact dosages):    Preoxygenation:  Nasal cannula   Sedation:  Propofol   Intended level of sedation: deep   Analgesia:  Fentanyl   Intra-procedure monitoring:  Blood pressure monitoring, continuous capnometry, frequent LOC assessments, frequent vital sign checks, continuous pulse oximetry and cardiac monitor   Intra-procedure events: none     Total Provider sedation time (minutes):  10 Post-procedure details:    Post-sedation assessment completed:  12/01/2019 11:28 PM   Attendance: Constant attendance by certified staff until patient recovered     Recovery: Patient returned to pre-procedure baseline     Post-sedation assessments completed and reviewed: airway patency,  cardiovascular function, hydration status, mental status, pain level, respiratory function and temperature     Post-sedation assessments completed and reviewed: nausea/vomiting not reviewed     Patient is stable for discharge or admission: yes     Patient tolerance:  Tolerated well, no immediate complications .Ortho Injury Treatment  Date/Time: 12/01/2019 11:28 PM Performed by: Davonna Belling, MD Authorized by: Davonna Belling, MD   Consent:    Consent obtained:  Verbal   Consent given by:  Patient   Risks discussed:  Fracture, recurrent dislocation, nerve damage, restricted joint movement and vascular damage   Alternatives discussed:  No treatment, alternative treatment, immobilization and referralInjury location: ankle Location details: left ankle Injury type: fracture Fracture type: bimalleolar Pre-procedure neurovascular assessment: neurovascularly intact Pre-procedure distal perfusion: normal Pre-procedure neurological function: normal Pre-procedure range of motion: reduced  Anesthesia: Local anesthesia used: no  Patient sedated: Yes. Refer to sedation procedure documentation for details of sedation. Manipulation performed: yes Skin traction used: no Skeletal traction used: no Reduction successful: yes X-ray confirmed reduction: yes Immobilization: splint Splint type: short leg Post-procedure neurovascular assessment: post-procedure neurovascularly intact Post-procedure distal perfusion: normal Post-procedure neurological function: normal Post-procedure range of motion: improved Patient tolerance: patient tolerated the procedure well with no immediate complications    (including critical care time)  Medications Ordered in ED Medications  propofol (DIPRIVAN) 10 mg/mL bolus/IV push 53.3 mg (53.3 mg Intravenous See Procedure Record 12/01/19 2312)  fentaNYL (SUBLIMAZE) injection 50 mcg (50 mcg Intravenous See Procedure Record 12/01/19 2312)  fentaNYL (SUBLIMAZE)  injection (50 mcg Intravenous Given 12/01/19 2240)  propofol (DIPRIVAN) 10 mg/mL bolus/IV push (3,198 mcg Intravenous Given 12/01/19 2251)    ED Course  I have reviewed the triage vital signs and the nursing notes.  Pertinent labs & imaging results that were available during my care of the patient were reviewed by me and considered in my medical decision making (see chart for details).    MDM Rules/Calculators/A&P                      Patient with bimalleolar ankle fracture.  Reduced in the ER under sedation.  Feels better.  Improved to baseline mental status.  Discharge home with outpatient follow-up. Final Clinical Impression(s) / ED Diagnoses Final diagnoses:  History of reduction of closed fracture  Bimalleolar ankle fracture, left, closed, initial encounter    Rx / DC Orders ED Discharge Orders         Ordered    HYDROcodone-acetaminophen (  NORCO/VICODIN) 5-325 MG tablet  Every 6 hours PRN     12/01/19 2325           Davonna Belling, MD 12/01/19 2330

## 2019-12-01 NOTE — Sedation Documentation (Signed)
XR at bedside

## 2019-12-01 NOTE — ED Triage Notes (Signed)
Pt states that she tripped over a water water and twisted her left ankle, he ankle is very swollen and the pain is 10/10

## 2019-12-02 NOTE — ED Notes (Signed)
Pt verbalized discharge instructions and follow up care. Alert and ambulatory. No IV. Has a ride home.

## 2019-12-03 ENCOUNTER — Other Ambulatory Visit: Payer: Self-pay | Admitting: Physician Assistant

## 2019-12-03 ENCOUNTER — Encounter: Payer: Self-pay | Admitting: Orthopedic Surgery

## 2019-12-03 ENCOUNTER — Ambulatory Visit (INDEPENDENT_AMBULATORY_CARE_PROVIDER_SITE_OTHER): Payer: 59 | Admitting: Orthopedic Surgery

## 2019-12-03 ENCOUNTER — Other Ambulatory Visit: Payer: Self-pay

## 2019-12-03 ENCOUNTER — Encounter (HOSPITAL_COMMUNITY): Payer: Self-pay | Admitting: Orthopedic Surgery

## 2019-12-03 DIAGNOSIS — S82842A Displaced bimalleolar fracture of left lower leg, initial encounter for closed fracture: Secondary | ICD-10-CM

## 2019-12-03 NOTE — Progress Notes (Signed)
Office Visit Note   Patient: Rachel Graham           Date of Birth: 11-26-1971           MRN: [id number] Visit Date: 12/03/2019              Requested by: Hoyt Koch, MD [address] [place],  [place] [address] PCP: Hoyt Koch, MD  No chief complaint on file.     HPI: Patient is a 49 year old woman who sustained an acute left ankle fracture on January 5.  She states she slipped on water she was seen in the emergency room underwent closed reduction and splinting and is seen today for initial evaluation.  Assessment & Plan: Visit Diagnoses:  1. Bimalleolar ankle fracture, left, closed, initial encounter     Plan: We will plan for open reduction internal fixation of the displaced closed bimalleolar left ankle fracture.  Risks and benefits were discussed including infection DVT arthritis need for additional surgery.  Patient states she understands wished to proceed at this time we will plan for outpatient surgery tomorrow with a incisional wound VAC fracture boot and follow-up in the office in a week.  Follow-Up Instructions: No follow-ups on file.   Ortho Exam  Patient is alert, oriented, no adenopathy, well-dressed, normal affect, normal respiratory effort. Examination patient has a good dorsalis pedis pulse no arterial insufficiency.  There are no blisters there is swelling but no skin ulceration or pending skin breakdown.  Radiographs shows a displaced Weber B bimalleolar left ankle fracture.  The fracture has been reduced in the emergency room.  The splint was reapplied.  Imaging: No results found. No images are attached to the encounter.  Labs: Lab Results  Component Value Date   HGBA1C 5.7 08/29/2017   HGBA1C 5.6 02/25/2015   REPTSTATUS 01/03/2013 FINAL 12/31/2012   GRAMSTAIN  12/31/2012    RARE WBC PRESENT, PREDOMINANTLY MONONUCLEAR NO SQUAMOUS EPITHELIAL CELLS SEEN NO ORGANISMS SEEN   CULT FEW STAPHYLOCOCCUS SPECIES (COAGULASE  NEGATIVE) 12/31/2012     Lab Results  Component Value Date   ALBUMIN 4.2 08/29/2017   ALBUMIN 4.3 03/14/2016   ALBUMIN 4.0 02/25/2015    No results found for: MG Lab Results  Component Value Date   VD25OH 48.93 08/29/2017   VD25OH 35.86 03/14/2016    No results found for: PREALBUMIN CBC EXTENDED Latest Ref Rng & Units 08/29/2017 03/14/2016 02/25/2015  WBC 4.0 - 10.5 K/uL 6.6 5.7 5.7  RBC 3.87 - 5.11 Mil/uL 4.21 4.19 3.94  HGB 12.0 - 15.0 g/dL 11.8(L) 11.9(L) 11.5(L)  HCT 36.0 - 46.0 % 36.5 36.1 34.4(L)  PLT 150.0 - 400.0 K/uL 270.0 248.0 289.0     There is no height or weight on file to calculate BMI.  Orders:  No orders of the defined types were placed in this encounter.  No orders of the defined types were placed in this encounter.    Procedures: No procedures performed  Clinical Data: No additional findings.  ROS:  All other systems negative, except as noted in the HPI. Review of Systems  Objective: Vital Signs: LMP 11/27/2016   Specialty Comments:  No specialty comments available.  PMFS History: Patient Active Problem List   Diagnosis Date Noted  . Allergic reaction 06/16/2019  . Hyperglycemia 06/16/2019  . Postmenopausal symptoms 01/20/2019  . Viral URI with cough 01/20/2019  . Patellofemoral syndrome of both knees 09/09/2017  . Osteochondroma 05/31/2015  . Allergic rhinitis 03/24/2015  . Obesity  02/25/2015  . Routine general medical examination at a health care facility 02/25/2015   Past Medical History:  Diagnosis Date  . Blood in stool   . Contact lens/glasses fitting    wears glases or contacts  . Medical history non-contributory     Family History  Problem Relation Age of Onset  . Diabetes Mother   . Diabetes Father   . Diabetes Sister   . Diabetes Brother     Past Surgical History:  Procedure Laterality Date  . DIAGNOSTIC LAPAROSCOPY  2010   lt ovarian cyst-LOA  . fibroids surgery  2008   d/c hysteroscopy  . GANGLION CYST  EXCISION Right 04/27/2014   Procedure: RIGHT WRIST EXCISION OF MASS ;  Surgeon: Tennis Must, MD;  Location: [place];  Service: Orthopedics;  Laterality: Right;   Social History   Occupational History  . Not on file  Tobacco Use  . Smoking status: Never Smoker  . Smokeless tobacco: Never Used  Substance and Sexual Activity  . Alcohol use: Yes    Comment: occasional  . Drug use: No  . Sexual activity: Never    Birth control/protection: None

## 2019-12-03 NOTE — Progress Notes (Signed)
Mrs Rachel Graham denies chest pain or shortness of breath.  Patient states that she was tested for Covid at [facility], she is aware that we are unable to see results in the computer- she cannot see results in My Chart. Mrs Rachel Graham called Health at work today and was informed that test was negative. Mrs Rachel Graham will go to [facility] to be tested before she arrives at the hospital.  I instructed patient to not eat anything after midnight. I informed patient that she may have clear liquids until 1120.  I went over what clear liquids are, Mrs Rachel Graham states thay she usually drinks water, I encouraged patient to drink something with carbohydrates, patient said she will drink a soda.

## 2019-12-04 ENCOUNTER — Ambulatory Visit (HOSPITAL_COMMUNITY): Payer: 59 | Admitting: Anesthesiology

## 2019-12-04 ENCOUNTER — Encounter (HOSPITAL_COMMUNITY)
Admission: RE | Disposition: A | Discharge status: Home / Self Care | Payer: Self-pay | Source: Home / Self Care | Attending: Orthopedic Surgery

## 2019-12-04 ENCOUNTER — Ambulatory Visit (HOSPITAL_COMMUNITY)
Admission: RE | Admit: 2019-12-04 | Discharge: 2019-12-04 | Disposition: A | Discharge status: Home / Self Care | Payer: 59 | Attending: Orthopedic Surgery | Admitting: Orthopedic Surgery

## 2019-12-04 ENCOUNTER — Encounter (HOSPITAL_COMMUNITY): Payer: Self-pay | Admitting: Orthopedic Surgery

## 2019-12-04 ENCOUNTER — Other Ambulatory Visit: Payer: Self-pay

## 2019-12-04 DIAGNOSIS — W010XXA Fall on same level from slipping, tripping and stumbling without subsequent striking against object, initial encounter: Secondary | ICD-10-CM | POA: Insufficient documentation

## 2019-12-04 DIAGNOSIS — S82842A Displaced bimalleolar fracture of left lower leg, initial encounter for closed fracture: Secondary | ICD-10-CM | POA: Diagnosis not present

## 2019-12-04 DIAGNOSIS — Y939 Activity, unspecified: Secondary | ICD-10-CM | POA: Diagnosis not present

## 2019-12-04 DIAGNOSIS — R739 Hyperglycemia, unspecified: Secondary | ICD-10-CM | POA: Diagnosis not present

## 2019-12-04 DIAGNOSIS — Z833 Family history of diabetes mellitus: Secondary | ICD-10-CM | POA: Diagnosis not present

## 2019-12-04 HISTORY — PX: ORIF ANKLE FRACTURE: SHX5408

## 2019-12-04 LAB — CBC
HCT: 38.9 % (ref 36.0–46.0)
Hemoglobin: 12.3 g/dL (ref 12.0–15.0)
MCH: 28.9 pg (ref 26.0–34.0)
MCHC: 31.6 g/dL (ref 30.0–36.0)
MCV: 91.3 fL (ref 80.0–100.0)
Platelets: 251 10*3/uL (ref 150–400)
RBC: 4.26 MIL/uL (ref 3.87–5.11)
RDW: 13.1 % (ref 11.5–15.5)
WBC: 7.7 10*3/uL (ref 4.0–10.5)
nRBC: 0 % (ref 0.0–0.2)

## 2019-12-04 SURGERY — OPEN REDUCTION INTERNAL FIXATION (ORIF) ANKLE FRACTURE
Anesthesia: Regional | Site: Ankle | Laterality: Left

## 2019-12-04 MED ORDER — MIDAZOLAM HCL 2 MG/2ML IJ SOLN
INTRAMUSCULAR | Status: DC | PRN
  Administered 2019-12-04: 2 mg via INTRAVENOUS

## 2019-12-04 MED ORDER — CLONIDINE HCL (ANALGESIA) 100 MCG/ML EP SOLN
EPIDURAL | Status: DC | PRN
  Administered 2019-12-04: 100 ug

## 2019-12-04 MED ORDER — ONDANSETRON HCL 4 MG/2ML IJ SOLN: 4.0000 mg | Freq: Once | INTRAMUSCULAR | Status: DC | PRN

## 2019-12-04 MED ORDER — DEXAMETHASONE SODIUM PHOSPHATE 10 MG/ML IJ SOLN
INTRAMUSCULAR | Status: DC | PRN
  Administered 2019-12-04: 5 mg via INTRAVENOUS

## 2019-12-04 MED ORDER — ROPIVACAINE HCL 5 MG/ML IJ SOLN
INTRAMUSCULAR | Status: DC | PRN
  Administered 2019-12-04: 30 mL via PERINEURAL

## 2019-12-04 MED ORDER — ROPIVACAINE HCL 7.5 MG/ML IJ SOLN
INTRAMUSCULAR | Status: DC | PRN
  Administered 2019-12-04: 20 mL via PERINEURAL

## 2019-12-04 MED ORDER — FENTANYL CITRATE (PF) 100 MCG/2ML IJ SOLN
INTRAMUSCULAR | Status: AC
  Administered 2019-12-04: 100 ug via INTRAVENOUS
  Filled 2019-12-04: qty 2

## 2019-12-04 MED ORDER — PHENYLEPHRINE HCL (PRESSORS) 10 MG/ML IV SOLN
INTRAVENOUS | Status: DC | PRN
  Administered 2019-12-04 (×3): 80 ug via INTRAVENOUS

## 2019-12-04 MED ORDER — MIDAZOLAM HCL 2 MG/2ML IJ SOLN: 2.0000 mg | Freq: Once | INTRAMUSCULAR | Status: AC

## 2019-12-04 MED ORDER — 0.9 % SODIUM CHLORIDE (POUR BTL) OPTIME
TOPICAL | Status: DC | PRN
  Administered 2019-12-04: 16:00:00 1000 mL

## 2019-12-04 MED ORDER — PROPOFOL 500 MG/50ML IV EMUL
INTRAVENOUS | Status: DC | PRN
  Administered 2019-12-04: 100 ug/kg/min via INTRAVENOUS

## 2019-12-04 MED ORDER — FENTANYL CITRATE (PF) 250 MCG/5ML IJ SOLN
INTRAMUSCULAR | Status: AC
  Filled 2019-12-04: qty 5

## 2019-12-04 MED ORDER — OXYCODONE HCL 5 MG PO TABS: 5.0000 mg | ORAL_TABLET | Freq: Once | ORAL | Status: DC | PRN

## 2019-12-04 MED ORDER — OXYCODONE HCL 5 MG/5ML PO SOLN: 5.0000 mg | Freq: Once | ORAL | Status: DC | PRN

## 2019-12-04 MED ORDER — CHLORHEXIDINE GLUCONATE 4 % EX LIQD: 60.0000 mL | Freq: Once | CUTANEOUS | Status: DC

## 2019-12-04 MED ORDER — MIDAZOLAM HCL 2 MG/2ML IJ SOLN
INTRAMUSCULAR | Status: AC
  Filled 2019-12-04: qty 2

## 2019-12-04 MED ORDER — HYDROMORPHONE HCL 1 MG/ML IJ SOLN: 0.2500 mg | INTRAMUSCULAR | Status: DC | PRN

## 2019-12-04 MED ORDER — LACTATED RINGERS IV SOLN: INTRAVENOUS | Status: DC

## 2019-12-04 MED ORDER — OXYCODONE-ACETAMINOPHEN 5-325 MG PO TABS: 1.0000 | ORAL_TABLET | ORAL | 0 refills | Status: DC | PRN

## 2019-12-04 MED ORDER — MIDAZOLAM HCL 2 MG/2ML IJ SOLN
INTRAMUSCULAR | Status: AC
  Administered 2019-12-04: 2 mg via INTRAVENOUS
  Filled 2019-12-04: qty 2

## 2019-12-04 MED ORDER — LIDOCAINE HCL (CARDIAC) PF 100 MG/5ML IV SOSY
PREFILLED_SYRINGE | INTRAVENOUS | Status: DC | PRN
  Administered 2019-12-04: 60 mg via INTRATRACHEAL

## 2019-12-04 MED ORDER — KETOROLAC TROMETHAMINE 30 MG/ML IJ SOLN: 30.0000 mg | Freq: Once | INTRAMUSCULAR | Status: DC | PRN

## 2019-12-04 MED ORDER — CEFAZOLIN SODIUM-DEXTROSE 2-4 GM/100ML-% IV SOLN
2.0000 g | INTRAVENOUS | Status: AC
  Administered 2019-12-04: 16:00:00 2 g via INTRAVENOUS
  Filled 2019-12-04: qty 100

## 2019-12-04 MED ORDER — FENTANYL CITRATE (PF) 100 MCG/2ML IJ SOLN: 100.0000 ug | Freq: Once | INTRAMUSCULAR | Status: AC

## 2019-12-04 SURGICAL SUPPLY — 47 items
BANDAGE ESMARK 6X9 LF (GAUZE/BANDAGES/DRESSINGS) IMPLANT
BIT DRILL 2.5X110 QC LCP DISP (BIT) ×2 IMPLANT
BNDG CMPR 9X6 STRL LF SNTH (GAUZE/BANDAGES/DRESSINGS)
BNDG COHESIVE 4X5 TAN STRL (GAUZE/BANDAGES/DRESSINGS) ×3 IMPLANT
BNDG ESMARK 6X9 LF (GAUZE/BANDAGES/DRESSINGS)
BNDG GAUZE ELAST 4 BULKY (GAUZE/BANDAGES/DRESSINGS) ×1 IMPLANT
COVER SURGICAL LIGHT HANDLE (MISCELLANEOUS) ×3 IMPLANT
COVER WAND RF STERILE (DRAPES) ×1 IMPLANT
DRAPE INCISE IOBAN 66X45 STRL (DRAPES) ×4 IMPLANT
DRAPE OEC MINIVIEW 54X84 (DRAPES) ×2 IMPLANT
DRAPE U-SHAPE 47X51 STRL (DRAPES) ×3 IMPLANT
DRSG ADAPTIC 3X8 NADH LF (GAUZE/BANDAGES/DRESSINGS) ×1 IMPLANT
DRSG PAD ABDOMINAL 8X10 ST (GAUZE/BANDAGES/DRESSINGS) ×1 IMPLANT
DURAPREP 26ML APPLICATOR (WOUND CARE) ×3 IMPLANT
ELECT REM PT RETURN 9FT ADLT (ELECTROSURGICAL) ×3
ELECTRODE REM PT RTRN 9FT ADLT (ELECTROSURGICAL) ×1 IMPLANT
GAUZE SPONGE 4X4 12PLY STRL (GAUZE/BANDAGES/DRESSINGS) ×1 IMPLANT
GLOVE BIOGEL PI IND STRL 9 (GLOVE) ×1 IMPLANT
GLOVE BIOGEL PI INDICATOR 9 (GLOVE) ×2
GLOVE SURG ORTHO 9.0 STRL STRW (GLOVE) ×3 IMPLANT
GOWN STRL REUS W/ TWL XL LVL3 (GOWN DISPOSABLE) ×3 IMPLANT
GOWN STRL REUS W/TWL XL LVL3 (GOWN DISPOSABLE) ×9
KIT BASIN OR (CUSTOM PROCEDURE TRAY) ×3 IMPLANT
KIT TURNOVER KIT B (KITS) ×3 IMPLANT
MANIFOLD NEPTUNE II (INSTRUMENTS) ×3 IMPLANT
NS IRRIG 1000ML POUR BTL (IV SOLUTION) ×3 IMPLANT
PACK ORTHO EXTREMITY (CUSTOM PROCEDURE TRAY) ×3 IMPLANT
PAD ARMBOARD 7.5X6 YLW CONV (MISCELLANEOUS) ×6 IMPLANT
PLATE LCP 3.5 1/3 TUB 8HX93 (Plate) ×2 IMPLANT
PREVENA RESTOR AXIOFORM 29X28 (GAUZE/BANDAGES/DRESSINGS) ×2 IMPLANT
SCREW CORTEX 3.5 12MM (Screw) ×6 IMPLANT
SCREW LOCK CORT ST 3.5X12 (Screw) IMPLANT
SCREW LOCK T15 FT 14X3.5X2.9X (Screw) IMPLANT
SCREW LOCK T15 FT 16X3.5X2.9X (Screw) IMPLANT
SCREW LOCKING 3.5X14 (Screw) ×3 IMPLANT
SCREW LOCKING 3.5X16 (Screw) ×3 IMPLANT
SCREW SHORT THREAD 4.0X40 (Screw) ×4 IMPLANT
STAPLER VISISTAT 35W (STAPLE) IMPLANT
SUCTION FRAZIER HANDLE 10FR (MISCELLANEOUS) ×2
SUCTION TUBE FRAZIER 10FR DISP (MISCELLANEOUS) ×1 IMPLANT
SUT ETHILON 2 0 PSLX (SUTURE) ×4 IMPLANT
SUT VIC AB 2-0 CT1 27 (SUTURE) ×3
SUT VIC AB 2-0 CT1 TAPERPNT 27 (SUTURE) ×1 IMPLANT
TOWEL GREEN STERILE (TOWEL DISPOSABLE) ×3 IMPLANT
TOWEL GREEN STERILE FF (TOWEL DISPOSABLE) ×3 IMPLANT
TUBE CONNECTING 12'X1/4 (SUCTIONS) ×1
TUBE CONNECTING 12X1/4 (SUCTIONS) ×2 IMPLANT

## 2019-12-04 NOTE — Transfer of Care (Signed)
Immediate Anesthesia Transfer of Care Note  Patient: Rachel Graham  Procedure(s) Performed: OPEN REDUCTION INTERNAL FIXATION (ORIF) LEFT BIMALLEOLAR ANKLE FRACTURE (Left Ankle)  Patient Location: PACU  Anesthesia Type:MAC and Regional  Level of Consciousness: drowsy and patient cooperative  Airway & Oxygen Therapy: Patient Spontanous Breathing  Post-op Assessment: Report given to RN and Post -op Vital signs reviewed and stable  Post vital signs: Reviewed and stable  Last Vitals:  Vitals Value Taken Time  BP 99/73 12/04/19 1631  Temp    Pulse 70 12/04/19 1632  Resp 18 12/04/19 1632  SpO2 98 % 12/04/19 1632  Vitals shown include unvalidated device data.  Last Pain:  Vitals:   12/04/19 1213  TempSrc:   PainSc: 8       Patients Stated Pain Goal: 4 (83/47/58 3074)  Complications: No apparent anesthesia complications

## 2019-12-04 NOTE — Anesthesia Preprocedure Evaluation (Signed)
Anesthesia Evaluation  Patient identified by MRN, date of birth, ID band Patient awake    Reviewed: Allergy & Precautions, NPO status , Patient's Chart, lab work & pertinent test results  Airway Mallampati: I  TM Distance: >3 FB Neck ROM: Full    Dental no notable dental hx. (+) Teeth Intact, Loose   Pulmonary neg pulmonary ROS,    Pulmonary exam normal breath sounds clear to auscultation       Cardiovascular negative cardio ROS Normal cardiovascular exam Rhythm:Regular Rate:Normal     Neuro/Psych negative neurological ROS  negative psych ROS   GI/Hepatic negative GI ROS, Neg liver ROS,   Endo/Other  negative endocrine ROS  Renal/GU negative Renal ROS     Musculoskeletal negative musculoskeletal ROS (+)   Abdominal   Peds  Hematology negative hematology ROS (+)   Anesthesia Other Findings   Reproductive/Obstetrics negative OB ROS                             Anesthesia Physical Anesthesia Plan  ASA: III  Anesthesia Plan: Regional   Post-op Pain Management:    Induction:   PONV Risk Score and Plan:   Airway Management Planned: Nasal Cannula and Natural Airway  Additional Equipment:   Intra-op Plan:   Post-operative Plan:   Informed Consent:     Dental advisory given  Plan Discussed with:   Anesthesia Plan Comments: (Bimalleolar ankle fx under L popliteal and L adductor cansl blocks)        Anesthesia Quick Evaluation

## 2019-12-04 NOTE — H&P (Signed)
Rachel Graham is an 49 y.o. female.   Chief Complaint: Left Ankle Fracture HPI: Patient is a 49 year old woman who sustained an acute left ankle fracture on January 5.  She states she slipped on water she was seen in the emergency room underwent closed reduction and splinting and is seen today for initial evaluation  Past Medical History:  Diagnosis Date  . Blood in stool   . Contact lens/glasses fitting    wears glases or contacts    Past Surgical History:  Procedure Laterality Date  . DIAGNOSTIC LAPAROSCOPY  2010   lt ovarian cyst-LOA  . fibroids surgery  2008   d/c hysteroscopy  . GANGLION CYST EXCISION Right 04/27/2014   Procedure: RIGHT WRIST EXCISION OF MASS ;  Surgeon: Tennis Must, MD;  Location: [place];  Service: Orthopedics;  Laterality: Right;  . INCISION AND DRAINAGE ABSCESS     buttocks- "boil"    Family History  Problem Relation Age of Onset  . Diabetes Mother   . Diabetes Father   . Diabetes Sister   . Diabetes Brother    Social History:  reports that she has never smoked. She has never used smokeless tobacco. She reports current alcohol use. She reports that she does not use drugs.  Allergies: No Known Allergies  No medications prior to admission.    No results found for this or any previous visit (from the past 48 hour(s)). No results found.  Review of Systems  All other systems reviewed and are negative.   Last menstrual period 11/27/2016. Physical Exam  Patient is alert, oriented, no adenopathy, well-dressed, normal affect, normal respiratory effort. Examination patient has a good dorsalis pedis pulse no arterial insufficiency.  There are no blisters there is swelling but no skin ulceration or pending skin breakdown.  Radiographs shows a displaced Weber B bimalleolar left ankle fracture.  The fracture has been reduced in the emergency room.  The splint was reapplied. Assessment/Plan 1. Bimalleolar ankle fracture, left,  closed, initial encounter     Plan: We will plan for open reduction internal fixation of the displaced closed bimalleolar left ankle fracture.  Risks and benefits were discussed including infection DVT arthritis need for additional surgery.  Patient states she understands wished to proceed at this time we will plan for outpatient surgery tomorrow with a incisional wound VAC fracture boot and follow-up in the office in a week.   Bevely Graham Rachel Schnieders, PA 12/04/2019, 7:20 AM

## 2019-12-04 NOTE — Progress Notes (Signed)
Orthopedic Tech Progress Note Patient Details:  Rachel Graham 06/18/1971 [id number]  Ortho Devices Type of Ortho Device: CAM walker Ortho Device/Splint Interventions: Application   Post Interventions Patient Tolerated: Well   [place] Pink 12/04/2019, 5:23 PM

## 2019-12-04 NOTE — Anesthesia Procedure Notes (Addendum)
Anesthesia Regional Block: Adductor canal block   Pre-Anesthetic Checklist: ,, timeout performed, Correct Patient, Correct Site, Correct Laterality, Correct Procedure, Correct Position, site marked, Risks and benefits discussed,  Surgical consent,  Pre-op evaluation,  At surgeon's request and post-op pain management  Laterality: Lower and Left  Prep: chloraprep       Needles:  Injection technique: Single-shot  Needle Type: Echogenic Needle     Needle Length: 9cm  Needle Gauge: 22     Additional Needles:   Procedures:,,,, ultrasound used (permanent image in chart),,,,  Narrative:  Start time: 12/04/2019 1:10 PM End time: 12/04/2019 1:15 PM Injection made incrementally with aspirations every 5 mL.  Performed by: Personally  Anesthesiologist: Barnet Glasgow, MD  Additional Notes: Block assessed prior to surgery. Pt tolerated procedure well.

## 2019-12-04 NOTE — Op Note (Signed)
12/04/2019  4:28 PM  PATIENT:  Rachel Graham    PRE-OPERATIVE DIAGNOSIS:  Left Bimalleolar Ankle Fracture  POST-OPERATIVE DIAGNOSIS:  Same  PROCEDURE:  OPEN REDUCTION INTERNAL FIXATION (ORIF) LEFT BIMALLEOLAR ANKLE FRACTURE C arm fluoroscopy to verify reduction  SURGEON:  Newt Minion, MD  PHYSICIAN ASSISTANT:None ANESTHESIA:   General  PREOPERATIVE INDICATIONS:  COLLETTA WOLIN is a  49 y.o. female with a diagnosis of Left Bimalleolar Ankle Fracture who failed conservative measures and elected for surgical management.    The risks benefits and alternatives were discussed with the patient preoperatively including but not limited to the risks of infection, bleeding, nerve injury, cardiopulmonary complications, the need for revision surgery, among others, and the patient was willing to proceed.  OPERATIVE IMPLANTS: 8 hole one third tubular plate Synthes with 5 screws laterally 2 screws medially and  @ENCIMAGES @  OPERATIVE FINDINGS: Stable alignment with congruent mortise post reduction  OPERATIVE PROCEDURE: Patient was brought to the operating room after undergoing a regional block.  After adequate levels anesthesia were obtained patient's left lower extremity was prepped using DuraPrep draped into a sterile field a timeout was called.  A lateral incision was made over the fibula.  This is carried down to the fracture site which had a long oblique fracture.  The fracture was freshened irrigated with normal saline reduced and stabilized with a one third tubular plate 3 compression screws proximally 2 locking screws distally.  C-arm fluoroscopy verified restoration of the length and alignment of the fibula.  This was irrigated with normal saline and the incision was closed using 2-0 nylon.  Attention was then focused medially.  A medial incision was made over the medial malleolus longitudinally.  This was carried down to the fracture site the fracture edges were freshened the  wound was irrigated with normal saline the fracture and joint was irrigated with normal saline.  This was reduced with a dental pick 2 K wires were inserted C-arm fluoroscopy verified alignment and this was then stabilized with 240 mm screws.  C-arm fluoroscopy verified alignment of the mortise.  The wounds were closed using 2-0 nylon a arthroform Prevena dressing was applied this had a good suction fit patient was taken the PACU in stable condition.   DISCHARGE PLANNING:  Antibiotic duration: Preoperative antibiotics only  Weightbearing: Touchdown weightbearing on the left  Pain medication: Prescription for Percocet  Dressing care/ Wound VAC: Continue wound VAC for 1 week  Ambulatory devices: Walker or crutches  Discharge to: Home.  Follow-up: In the office 1 week post operative.

## 2019-12-04 NOTE — Anesthesia Procedure Notes (Signed)
Procedure Name: MAC Date/Time: 12/04/2019 3:33 PM Performed by: Kathryne Hitch, CRNA Pre-anesthesia Checklist: Patient identified, Emergency Drugs available, Suction available and Patient being monitored Patient Re-evaluated:Patient Re-evaluated prior to induction Oxygen Delivery Method: Simple face mask Preoxygenation: Pre-oxygenation with 100% oxygen Induction Type: IV induction Dental Injury: Teeth and Oropharynx as per pre-operative assessment

## 2019-12-04 NOTE — Anesthesia Postprocedure Evaluation (Signed)
Anesthesia Post Note  Patient: Rachel Graham  Procedure(s) Performed: OPEN REDUCTION INTERNAL FIXATION (ORIF) LEFT BIMALLEOLAR ANKLE FRACTURE (Left Ankle)     Patient location during evaluation: PACU Anesthesia Type: Regional Level of consciousness: awake and alert Pain management: pain level controlled Vital Signs Assessment: post-procedure vital signs reviewed and stable Respiratory status: spontaneous breathing, nonlabored ventilation, respiratory function stable and patient connected to nasal cannula oxygen Cardiovascular status: stable and blood pressure returned to baseline Postop Assessment: no apparent nausea or vomiting Anesthetic complications: no    Last Vitals:  Vitals:   12/04/19 1310 12/04/19 1630  BP: 116/72   Pulse: 75   Resp: (!) 21   Temp:  36.5 C  SpO2: 100%     Last Pain:  Vitals:   12/04/19 1630  TempSrc:   PainSc: 0-No pain                 Barnet Glasgow

## 2019-12-04 NOTE — Anesthesia Procedure Notes (Addendum)
Anesthesia Regional Block: Popliteal block   Pre-Anesthetic Checklist: ,, timeout performed, Correct Patient, Correct Site, Correct Laterality, Correct Procedure, Correct Position, site marked, Risks and benefits discussed, pre-op evaluation,  At surgeon's request and post-op pain management  Laterality: Left  Prep: Maximum Sterile Barrier Precautions used, chloraprep       Needles:  Injection technique: Single-shot  Needle Type: Echogenic Needle     Needle Length: 9cm  Needle Gauge: 21     Additional Needles:   Procedures:,,,, ultrasound used (permanent image in chart),,,,  Narrative:  Start time: 12/04/2019 1:04 PM End time: 12/04/2019 1:09 PM Injection made incrementally with aspirations every 5 mL.  Performed by: Personally  Anesthesiologist: Barnet Glasgow, MD  Additional Notes: Block assessed. Patient tolerated procedure well.

## 2019-12-08 ENCOUNTER — Encounter: Payer: Self-pay | Admitting: *Deleted

## 2019-12-10 ENCOUNTER — Other Ambulatory Visit: Payer: Self-pay

## 2019-12-10 ENCOUNTER — Ambulatory Visit (INDEPENDENT_AMBULATORY_CARE_PROVIDER_SITE_OTHER): Payer: 59 | Admitting: Physician Assistant

## 2019-12-10 ENCOUNTER — Encounter: Payer: Self-pay | Admitting: Physician Assistant

## 2019-12-10 VITALS — Ht 63.0 in | Wt 230.0 lb

## 2019-12-10 DIAGNOSIS — S82842A Displaced bimalleolar fracture of left lower leg, initial encounter for closed fracture: Secondary | ICD-10-CM

## 2019-12-10 MED ORDER — OXYCODONE-ACETAMINOPHEN 5-325 MG PO TABS: 1.0000 | ORAL_TABLET | ORAL | 0 refills | Status: AC | PRN

## 2019-12-10 NOTE — Progress Notes (Signed)
Office Visit Note   Patient: Rachel Graham           Date of Birth: 04-Sep-1971           MRN: [id number] Visit Date: 12/10/2019              Requested by: Hoyt Koch, MD [address] [place],  [place] [address] PCP: Hoyt Koch, MD  Chief Complaint  Patient presents with  . Left Ankle - Routine Post Op    12/04/2019 ORIF Bimalleolar Ankle Fx      HPI: Patient is 1 week s/p Orif Left Bimalleolar Ankle fracture. She is doing well .Denies fever,chills or calf pain  Assessment & Plan: Visit Diagnoses: No diagnosis found.  Plan: She will elevate her ankle I instructed her on very gentle ankle flexion so her ankle does not get too stiff. We will obtain xrays at her next visit  Follow-Up Instructions: No follow-ups on file.   Ortho Exam  Patient is alert, oriented, no adenopathy, well-dressed, normal affect, normal respiratory effort. Left ankle. Moderate amount of soft tissue swelling. Pulses intact compartments soft nontender. No Homans sign. Wound edges healthy and well approximated  Imaging: No results found. No images are attached to the encounter.  Labs: Lab Results  Component Value Date   HGBA1C 5.7 08/29/2017   HGBA1C 5.6 02/25/2015   REPTSTATUS 01/03/2013 FINAL 12/31/2012   GRAMSTAIN  12/31/2012    RARE WBC PRESENT, PREDOMINANTLY MONONUCLEAR NO SQUAMOUS EPITHELIAL CELLS SEEN NO ORGANISMS SEEN   CULT FEW STAPHYLOCOCCUS SPECIES (COAGULASE NEGATIVE) 12/31/2012     Lab Results  Component Value Date   ALBUMIN 4.2 08/29/2017   ALBUMIN 4.3 03/14/2016   ALBUMIN 4.0 02/25/2015    No results found for: MG Lab Results  Component Value Date   VD25OH 48.93 08/29/2017   VD25OH 35.86 03/14/2016    No results found for: PREALBUMIN CBC EXTENDED Latest Ref Rng & Units 12/04/2019 08/29/2017 03/14/2016  WBC 4.0 - 10.5 K/uL 7.7 6.6 5.7  RBC 3.87 - 5.11 MIL/uL 4.26 4.21 4.19  HGB 12.0 - 15.0 g/dL 12.3 11.8(L) 11.9(L)  HCT 36.0 - 46.0 %  38.9 36.5 36.1  PLT 150 - 400 K/uL 251 270.0 248.0     Body mass index is 40.74 kg/m.  Orders:  No orders of the defined types were placed in this encounter.  Meds ordered this encounter  Medications  . oxyCODONE-acetaminophen (PERCOCET) 5-325 MG tablet    Sig: Take 1 tablet by mouth every 4 (four) hours as needed for severe pain.    Dispense:  30 tablet    Refill:  0     Procedures: No procedures performed  Clinical Data: No additional findings.  ROS:  All other systems negative, except as noted in the HPI. Review of Systems  Objective: Vital Signs: Ht 5\' 3"  (1.6 m)   Wt 230 lb (104.3 kg)   LMP 11/27/2016   BMI 40.74 kg/m   Specialty Comments:  No specialty comments available.  PMFS History: Patient Active Problem List   Diagnosis Date Noted  . Bimalleolar ankle fracture, left, closed, initial encounter   . Allergic reaction 06/16/2019  . Hyperglycemia 06/16/2019  . Postmenopausal symptoms 01/20/2019  . Viral URI with cough 01/20/2019  . Patellofemoral syndrome of both knees 09/09/2017  . Osteochondroma 05/31/2015  . Allergic rhinitis 03/24/2015  . Obesity 02/25/2015  . Routine general medical examination at a health care facility 02/25/2015   Past Medical History:  Diagnosis  Date  . Blood in stool   . Contact lens/glasses fitting    wears glases or contacts    Family History  Problem Relation Age of Onset  . Diabetes Mother   . Diabetes Father   . Diabetes Sister   . Diabetes Brother     Past Surgical History:  Procedure Laterality Date  . DIAGNOSTIC LAPAROSCOPY  2010   lt ovarian cyst-LOA  . fibroids surgery  2008   d/c hysteroscopy  . GANGLION CYST EXCISION Right 04/27/2014   Procedure: RIGHT WRIST EXCISION OF MASS ;  Surgeon: Tennis Must, MD;  Location: [facility];  Service: Orthopedics;  Laterality: Right;  . INCISION AND DRAINAGE ABSCESS     buttocks- "boil"  . ORIF ANKLE FRACTURE Left 12/04/2019   Procedure: OPEN  REDUCTION INTERNAL FIXATION (ORIF) LEFT BIMALLEOLAR ANKLE FRACTURE;  Surgeon: Newt Minion, MD;  Location: [facility];  Service: Orthopedics;  Laterality: Left;   Social History   Occupational History  . Not on file  Tobacco Use  . Smoking status: Never Smoker  . Smokeless tobacco: Never Used  Substance and Sexual Activity  . Alcohol use: Yes    Comment: occasional  . Drug use: No  . Sexual activity: Never    Birth control/protection: None

## 2019-12-11 ENCOUNTER — Telehealth: Payer: Self-pay | Admitting: Orthopedic Surgery

## 2019-12-11 NOTE — Telephone Encounter (Signed)
Patient called.   [employer] sent over paperwork on the patient's behalf to be filled out. Neither her nor [employer] have recieved anything back. She would like a call back with the status.   Call back number: [phone]

## 2019-12-14 NOTE — Telephone Encounter (Signed)
[employer] has forms. She needs to contact them. The number is [phone]. Thanks, Star!

## 2019-12-14 NOTE — Telephone Encounter (Signed)
Good morning Rachel Graham,  Have you received any paperwork pertaining to this patient? I have not seen anything on her or in media. Thank you

## 2019-12-14 NOTE — Telephone Encounter (Signed)
Patient was called and informed of message below. Thank you

## 2019-12-17 ENCOUNTER — Ambulatory Visit (INDEPENDENT_AMBULATORY_CARE_PROVIDER_SITE_OTHER): Payer: 59

## 2019-12-17 ENCOUNTER — Encounter: Payer: Self-pay | Admitting: Physician Assistant

## 2019-12-17 ENCOUNTER — Ambulatory Visit: Payer: 59 | Admitting: Physician Assistant

## 2019-12-17 ENCOUNTER — Other Ambulatory Visit: Payer: Self-pay

## 2019-12-17 VITALS — Ht 63.0 in | Wt 230.0 lb

## 2019-12-17 DIAGNOSIS — S82842A Displaced bimalleolar fracture of left lower leg, initial encounter for closed fracture: Secondary | ICD-10-CM | POA: Diagnosis not present

## 2019-12-17 NOTE — Progress Notes (Signed)
Office Visit Note   Patient: Rachel Graham           Date of Birth: 1971/05/30           MRN: [id number] Visit Date: 12/17/2019              Requested by: Hoyt Koch, MD [address] [place],  [place] [address] PCP: Hoyt Koch, MD  Chief Complaint  Patient presents with  . Left Ankle - Routine Post Op    12/04/2019 ORIF left ankle fx      HPI: This is a pleasant woman who is now 2 weeks status post ORIF left ankle fracture she is doing well denies fever, chills, or calf pain  Assessment & Plan: Visit Diagnoses:  1. Bimalleolar ankle fracture, left, closed, initial encounter     Plan: She may place a small amount of weight on her foot in the boot for balance.  Follow-up early next week and I believe at that time we could harvest her sutures.  She does not need an x-ray at that visit  Follow-Up Instructions: No follow-ups on file.   Ortho Exam  Patient is alert, oriented, no adenopathy, well-dressed, normal affect, normal respiratory effort. Focused examination demonstrates swelling is significantly decreased from last week.  She still has some wound healing taking place especially laterally medial wound well approximated wound edges laterally well approximated wound edges which are healthy no dehiscence no foul smell no drainage  Imaging: No results found. No images are attached to the encounter.  Labs: Lab Results  Component Value Date   HGBA1C 5.7 08/29/2017   HGBA1C 5.6 02/25/2015   REPTSTATUS 01/03/2013 FINAL 12/31/2012   GRAMSTAIN  12/31/2012    RARE WBC PRESENT, PREDOMINANTLY MONONUCLEAR NO SQUAMOUS EPITHELIAL CELLS SEEN NO ORGANISMS SEEN   CULT FEW STAPHYLOCOCCUS SPECIES (COAGULASE NEGATIVE) 12/31/2012     Lab Results  Component Value Date   ALBUMIN 4.2 08/29/2017   ALBUMIN 4.3 03/14/2016   ALBUMIN 4.0 02/25/2015    No results found for: MG Lab Results  Component Value Date   VD25OH 48.93 08/29/2017   VD25OH 35.86  03/14/2016    No results found for: PREALBUMIN CBC EXTENDED Latest Ref Rng & Units 12/04/2019 08/29/2017 03/14/2016  WBC 4.0 - 10.5 K/uL 7.7 6.6 5.7  RBC 3.87 - 5.11 MIL/uL 4.26 4.21 4.19  HGB 12.0 - 15.0 g/dL 12.3 11.8(L) 11.9(L)  HCT 36.0 - 46.0 % 38.9 36.5 36.1  PLT 150 - 400 K/uL 251 270.0 248.0     Body mass index is 40.74 kg/m.  Orders:  Orders Placed This Encounter  Procedures  . XR Ankle Complete Left   No orders of the defined types were placed in this encounter.    Procedures: No procedures performed  Clinical Data: No additional findings.  ROS:  All other systems negative, except as noted in the HPI. Review of Systems  Objective: Vital Signs: Ht 5\' 3"  (1.6 m)   Wt 230 lb (104.3 kg)   LMP 11/27/2016   BMI 40.74 kg/m   Specialty Comments:  No specialty comments available.  PMFS History: Patient Active Problem List   Diagnosis Date Noted  . Bimalleolar ankle fracture, left, closed, initial encounter   . Allergic reaction 06/16/2019  . Hyperglycemia 06/16/2019  . Postmenopausal symptoms 01/20/2019  . Viral URI with cough 01/20/2019  . Patellofemoral syndrome of both knees 09/09/2017  . Osteochondroma 05/31/2015  . Allergic rhinitis 03/24/2015  . Obesity 02/25/2015  .  Routine general medical examination at a health care facility 02/25/2015   Past Medical History:  Diagnosis Date  . Blood in stool   . Contact lens/glasses fitting    wears glases or contacts    Family History  Problem Relation Age of Onset  . Diabetes Mother   . Diabetes Father   . Diabetes Sister   . Diabetes Brother     Past Surgical History:  Procedure Laterality Date  . DIAGNOSTIC LAPAROSCOPY  2010   lt ovarian cyst-LOA  . fibroids surgery  2008   d/c hysteroscopy  . GANGLION CYST EXCISION Right 04/27/2014   Procedure: RIGHT WRIST EXCISION OF MASS ;  Surgeon: Tennis Must, MD;  Location: [facility];  Service: Orthopedics;  Laterality: Right;  . INCISION  AND DRAINAGE ABSCESS     buttocks- "boil"  . ORIF ANKLE FRACTURE Left 12/04/2019   Procedure: OPEN REDUCTION INTERNAL FIXATION (ORIF) LEFT BIMALLEOLAR ANKLE FRACTURE;  Surgeon: Newt Minion, MD;  Location: [facility];  Service: Orthopedics;  Laterality: Left;   Social History   Occupational History  . Not on file  Tobacco Use  . Smoking status: Never Smoker  . Smokeless tobacco: Never Used  Substance and Sexual Activity  . Alcohol use: Yes    Comment: occasional  . Drug use: No  . Sexual activity: Never    Birth control/protection: None

## 2019-12-22 ENCOUNTER — Ambulatory Visit (INDEPENDENT_AMBULATORY_CARE_PROVIDER_SITE_OTHER): Payer: 59 | Admitting: Physician Assistant

## 2019-12-22 ENCOUNTER — Other Ambulatory Visit: Payer: Self-pay

## 2019-12-22 ENCOUNTER — Encounter: Payer: Self-pay | Admitting: Physician Assistant

## 2019-12-22 VITALS — Ht 63.0 in | Wt 230.0 lb

## 2019-12-22 DIAGNOSIS — S82842A Displaced bimalleolar fracture of left lower leg, initial encounter for closed fracture: Secondary | ICD-10-CM

## 2019-12-22 NOTE — Progress Notes (Signed)
Office Visit Note   Patient: Rachel Graham           Date of Birth: 13-Mar-1971           MRN: [id number] Visit Date: 12/22/2019              Requested by: Hoyt Koch, MD [address] [place],  [place] [address] PCP: Hoyt Koch, MD  Chief Complaint  Patient presents with  . Left Ankle - Routine Post Op    12/04/19 left ankle ORIF ankle  fx       HPI: This is a pleasant woman who is now almost 3 weeks status post ORIF of left ankle fracture she is doing well she denies fever, chills, or calf pain   Assessment & Plan: Visit Diagnoses: No diagnosis found.  Plan: In 24 hours she may begin bearing weight in her boot.  I do not want her to do any weightbearing out of her boot.  She will follow-up in 2 weeks at which time x-rays of her ankle should be taken.  We also discussed working on ankle range of motion especially dorsiflexion with a rolled towel or a band.  Follow-Up Instructions: No follow-ups on file.   Ortho Exam  Patient is alert, oriented, no adenopathy, well-dressed, normal affect, normal respiratory effort. Examination of her left ankle demonstrates healed surgical incisions both medially and laterally.  Significant decrease in swelling since her prior visit with skin wrinkling pulses are intact compartments are soft and nontender  Imaging: No results found. No images are attached to the encounter.  Labs: Lab Results  Component Value Date   HGBA1C 5.7 08/29/2017   HGBA1C 5.6 02/25/2015   REPTSTATUS 01/03/2013 FINAL 12/31/2012   GRAMSTAIN  12/31/2012    RARE WBC PRESENT, PREDOMINANTLY MONONUCLEAR NO SQUAMOUS EPITHELIAL CELLS SEEN NO ORGANISMS SEEN   CULT FEW STAPHYLOCOCCUS SPECIES (COAGULASE NEGATIVE) 12/31/2012     Lab Results  Component Value Date   ALBUMIN 4.2 08/29/2017   ALBUMIN 4.3 03/14/2016   ALBUMIN 4.0 02/25/2015    No results found for: MG Lab Results  Component Value Date   VD25OH 48.93 08/29/2017   VD25OH  35.86 03/14/2016    No results found for: PREALBUMIN CBC EXTENDED Latest Ref Rng & Units 12/04/2019 08/29/2017 03/14/2016  WBC 4.0 - 10.5 K/uL 7.7 6.6 5.7  RBC 3.87 - 5.11 MIL/uL 4.26 4.21 4.19  HGB 12.0 - 15.0 g/dL 12.3 11.8(L) 11.9(L)  HCT 36.0 - 46.0 % 38.9 36.5 36.1  PLT 150 - 400 K/uL 251 270.0 248.0     Body mass index is 40.74 kg/m.  Orders:  No orders of the defined types were placed in this encounter.  No orders of the defined types were placed in this encounter.    Procedures: No procedures performed  Clinical Data: No additional findings.  ROS:  All other systems negative, except as noted in the HPI. Review of Systems  Objective: Vital Signs: Ht 5\' 3"  (1.6 m)   Wt 230 lb (104.3 kg)   LMP 11/27/2016   BMI 40.74 kg/m   Specialty Comments:  No specialty comments available.  PMFS History: Patient Active Problem List   Diagnosis Date Noted  . Bimalleolar ankle fracture, left, closed, initial encounter   . Allergic reaction 06/16/2019  . Hyperglycemia 06/16/2019  . Postmenopausal symptoms 01/20/2019  . Viral URI with cough 01/20/2019  . Patellofemoral syndrome of both knees 09/09/2017  . Osteochondroma 05/31/2015  . Allergic rhinitis 03/24/2015  .  Obesity 02/25/2015  . Routine general medical examination at a health care facility 02/25/2015   Past Medical History:  Diagnosis Date  . Blood in stool   . Contact lens/glasses fitting    wears glases or contacts    Family History  Problem Relation Age of Onset  . Diabetes Mother   . Diabetes Father   . Diabetes Sister   . Diabetes Brother     Past Surgical History:  Procedure Laterality Date  . DIAGNOSTIC LAPAROSCOPY  2010   lt ovarian cyst-LOA  . fibroids surgery  2008   d/c hysteroscopy  . GANGLION CYST EXCISION Right 04/27/2014   Procedure: RIGHT WRIST EXCISION OF MASS ;  Surgeon: Tennis Must, MD;  Location: [facility];  Service: Orthopedics;  Laterality: Right;  . INCISION  AND DRAINAGE ABSCESS     buttocks- "boil"  . ORIF ANKLE FRACTURE Left 12/04/2019   Procedure: OPEN REDUCTION INTERNAL FIXATION (ORIF) LEFT BIMALLEOLAR ANKLE FRACTURE;  Surgeon: Newt Minion, MD;  Location: [facility];  Service: Orthopedics;  Laterality: Left;   Social History   Occupational History  . Not on file  Tobacco Use  . Smoking status: Never Smoker  . Smokeless tobacco: Never Used  Substance and Sexual Activity  . Alcohol use: Yes    Comment: occasional  . Drug use: No  . Sexual activity: Never    Birth control/protection: None

## 2020-01-05 ENCOUNTER — Encounter: Payer: Self-pay | Admitting: Physician Assistant

## 2020-01-05 ENCOUNTER — Ambulatory Visit (INDEPENDENT_AMBULATORY_CARE_PROVIDER_SITE_OTHER): Payer: 59

## 2020-01-05 ENCOUNTER — Other Ambulatory Visit: Payer: Self-pay

## 2020-01-05 ENCOUNTER — Ambulatory Visit (INDEPENDENT_AMBULATORY_CARE_PROVIDER_SITE_OTHER): Payer: 59 | Admitting: Physician Assistant

## 2020-01-05 VITALS — Ht 63.0 in | Wt 230.0 lb

## 2020-01-05 DIAGNOSIS — M25572 Pain in left ankle and joints of left foot: Secondary | ICD-10-CM

## 2020-01-05 NOTE — Progress Notes (Signed)
Office Visit Note   Patient: Rachel Graham           Date of Birth: 1971-03-21           MRN: [id number] Visit Date: 01/05/2020              Requested by: Hoyt Koch, MD [address] [place],  [place] [address] PCP: Hoyt Koch, MD  Chief Complaint  Patient presents with  . Left Ankle - Routine Post Op    12/04/19 left ankle ORIF       HPI: This is a pleasant 49 year old woman who is now 5 weeks status post open reduction internal fixation of a left ankle fracture bimalleolar.  She is doing well and has been advancing her weightbearing  Assessment & Plan: Visit Diagnoses:  1. Pain in left ankle and joints of left foot     Plan: I think she would benefit from a short course of physical therapy to wean out of her boot and work on functional and proprioceptive ankle strengthening and gait training she will follow up in 1 month  Follow-Up Instructions: No follow-ups on file.   Ortho Exam  Patient is alert, oriented, no adenopathy, well-dressed, normal affect, normal respiratory effort. Focused examination demonstrates well-healed surgical incision.  Swelling is mild compartments are soft and nontender no pain on passive stretch pulses are palpable  Imaging: No results found. No images are attached to the encounter.  Labs: Lab Results  Component Value Date   HGBA1C 5.7 08/29/2017   HGBA1C 5.6 02/25/2015   REPTSTATUS 01/03/2013 FINAL 12/31/2012   GRAMSTAIN  12/31/2012    RARE WBC PRESENT, PREDOMINANTLY MONONUCLEAR NO SQUAMOUS EPITHELIAL CELLS SEEN NO ORGANISMS SEEN   CULT FEW STAPHYLOCOCCUS SPECIES (COAGULASE NEGATIVE) 12/31/2012     Lab Results  Component Value Date   ALBUMIN 4.2 08/29/2017   ALBUMIN 4.3 03/14/2016   ALBUMIN 4.0 02/25/2015    No results found for: MG Lab Results  Component Value Date   VD25OH 48.93 08/29/2017   VD25OH 35.86 03/14/2016    No results found for: PREALBUMIN CBC EXTENDED Latest Ref Rng & Units  12/04/2019 08/29/2017 03/14/2016  WBC 4.0 - 10.5 K/uL 7.7 6.6 5.7  RBC 3.87 - 5.11 MIL/uL 4.26 4.21 4.19  HGB 12.0 - 15.0 g/dL 12.3 11.8(L) 11.9(L)  HCT 36.0 - 46.0 % 38.9 36.5 36.1  PLT 150 - 400 K/uL 251 270.0 248.0     Body mass index is 40.74 kg/m.  Orders:  Orders Placed This Encounter  Procedures  . XR Ankle Complete Left  . Ambulatory referral to Physical Therapy   No orders of the defined types were placed in this encounter.    Procedures: No procedures performed  Clinical Data: No additional findings.  ROS:  All other systems negative, except as noted in the HPI. Review of Systems  Objective: Vital Signs: Ht 5\' 3"  (1.6 m)   Wt 230 lb (104.3 kg)   LMP 11/27/2016   BMI 40.74 kg/m   Specialty Comments:  No specialty comments available.  PMFS History: Patient Active Problem List   Diagnosis Date Noted  . Bimalleolar ankle fracture, left, closed, initial encounter   . Allergic reaction 06/16/2019  . Hyperglycemia 06/16/2019  . Postmenopausal symptoms 01/20/2019  . Viral URI with cough 01/20/2019  . Patellofemoral syndrome of both knees 09/09/2017  . Osteochondroma 05/31/2015  . Allergic rhinitis 03/24/2015  . Obesity 02/25/2015  . Routine general medical examination at a health care  facility 02/25/2015   Past Medical History:  Diagnosis Date  . Blood in stool   . Contact lens/glasses fitting    wears glases or contacts    Family History  Problem Relation Age of Onset  . Diabetes Mother   . Diabetes Father   . Diabetes Sister   . Diabetes Brother     Past Surgical History:  Procedure Laterality Date  . DIAGNOSTIC LAPAROSCOPY  2010   lt ovarian cyst-LOA  . fibroids surgery  2008   d/c hysteroscopy  . GANGLION CYST EXCISION Right 04/27/2014   Procedure: RIGHT WRIST EXCISION OF MASS ;  Surgeon: Tennis Must, MD;  Location: [facility];  Service: Orthopedics;  Laterality: Right;  . INCISION AND DRAINAGE ABSCESS     buttocks- "boil"   . ORIF ANKLE FRACTURE Left 12/04/2019   Procedure: OPEN REDUCTION INTERNAL FIXATION (ORIF) LEFT BIMALLEOLAR ANKLE FRACTURE;  Surgeon: Newt Minion, MD;  Location: [facility];  Service: Orthopedics;  Laterality: Left;   Social History   Occupational History  . Not on file  Tobacco Use  . Smoking status: Never Smoker  . Smokeless tobacco: Never Used  Substance and Sexual Activity  . Alcohol use: Yes    Comment: occasional  . Drug use: No  . Sexual activity: Never    Birth control/protection: None

## 2020-01-08 ENCOUNTER — Encounter (HOSPITAL_COMMUNITY): Payer: Self-pay

## 2020-01-08 ENCOUNTER — Ambulatory Visit (HOSPITAL_COMMUNITY)
Admission: EM | Admit: 2020-01-08 | Discharge: 2020-01-08 | Disposition: A | Discharge status: Home / Self Care | Payer: 59 | Attending: Physician Assistant | Admitting: Physician Assistant

## 2020-01-08 ENCOUNTER — Other Ambulatory Visit: Payer: Self-pay

## 2020-01-08 DIAGNOSIS — L03213 Periorbital cellulitis: Secondary | ICD-10-CM | POA: Diagnosis not present

## 2020-01-08 DIAGNOSIS — H00014 Hordeolum externum left upper eyelid: Secondary | ICD-10-CM | POA: Diagnosis not present

## 2020-01-08 MED ORDER — ERYTHROMYCIN 5 MG/GM OP OINT: TOPICAL_OINTMENT | OPHTHALMIC | 0 refills | Status: DC

## 2020-01-08 MED ORDER — SULFAMETHOXAZOLE-TRIMETHOPRIM 800-160 MG PO TABS: 1.0000 | ORAL_TABLET | Freq: Two times a day (BID) | ORAL | 0 refills | Status: AC

## 2020-01-08 NOTE — Discharge Instructions (Signed)
Begin the bactrim and ointment tonight. Take the bactrim 2 times a day. Use the ointment daily.  Use warm compresses 4 times a day for 15-20 minutes a time.  Take 2x200 mg ibuprofen for pain every 4-6 hours  If this is not improving at all by Monday, call the ophthalmology office supplied.

## 2020-01-08 NOTE — ED Provider Notes (Signed)
[place]    CSN: [id number] Arrival date & time: 01/08/20  1618      History   Chief Complaint Chief Complaint  Patient presents with  . Eye Problem    HPI Rachel Graham is a 49 y.o. female.   Patient reports to urgent care today for 1 week of worsening Left eye pain and eyelid swelling. She reports it started as a small stye but has progressively become bigger and more painful. She has tried over the counter stye medications and warm compresses without improvement. She attempted to be seen by an ophthalmologist but they did not have openings. She is concerned because it is harder for her to open her eye fully now. She denies fever and chills. Denies pain with eye movements. She does report some blurry vision because of not being able to open her eye.     Past Medical History:  Diagnosis Date  . Blood in stool   . Contact lens/glasses fitting    wears glases or contacts    Patient Active Problem List   Diagnosis Date Noted  . Bimalleolar ankle fracture, left, closed, initial encounter   . Allergic reaction 06/16/2019  . Hyperglycemia 06/16/2019  . Postmenopausal symptoms 01/20/2019  . Viral URI with cough 01/20/2019  . Patellofemoral syndrome of both knees 09/09/2017  . Osteochondroma 05/31/2015  . Allergic rhinitis 03/24/2015  . Obesity 02/25/2015  . Routine general medical examination at a health care facility 02/25/2015    Past Surgical History:  Procedure Laterality Date  . DIAGNOSTIC LAPAROSCOPY  2010   lt ovarian cyst-LOA  . fibroids surgery  2008   d/c hysteroscopy  . GANGLION CYST EXCISION Right 04/27/2014   Procedure: RIGHT WRIST EXCISION OF MASS ;  Surgeon: Tennis Must, MD;  Location: [place];  Service: Orthopedics;  Laterality: Right;  . INCISION AND DRAINAGE ABSCESS     buttocks- "boil"  . ORIF ANKLE FRACTURE Left 12/04/2019   Procedure: OPEN REDUCTION INTERNAL FIXATION (ORIF) LEFT BIMALLEOLAR ANKLE FRACTURE;   Surgeon: Newt Minion, MD;  Location: [place];  Service: Orthopedics;  Laterality: Left;    OB History   No obstetric history on file.      Home Medications    Prior to Admission medications   Medication Sig Start Date End Date Taking? Authorizing Provider  escitalopram (LEXAPRO) 10 MG tablet Take 1 tablet (10 mg total) by mouth daily. 01/20/19   Hoyt Koch, MD  fluticasone Asencion Islam) 50 MCG/ACT nasal spray Place 2 sprays into both nostrils daily. 01/20/19   Hoyt Koch, MD  montelukast (SINGULAIR) 10 MG tablet Take 1 tablet (10 mg total) by mouth at bedtime. 01/20/19   Hoyt Koch, MD  oxyCODONE-acetaminophen (PERCOCET) 5-325 MG tablet Take 1 tablet by mouth every 4 (four) hours as needed for severe pain. 12/10/19 12/09/20  Persons, Bevely Palmer, PA    Family History Family History  Problem Relation Age of Onset  . Diabetes Mother   . Diabetes Father   . Diabetes Sister   . Diabetes Brother     Social History Social History   Tobacco Use  . Smoking status: Never Smoker  . Smokeless tobacco: Never Used  Substance Use Topics  . Alcohol use: Yes    Comment: occasional  . Drug use: No     Allergies   Patient has no known allergies.   Review of Systems Review of Systems  Constitutional: Negative for chills and fever.  Eyes:  Positive for pain and visual disturbance. Negative for photophobia, discharge and redness.  Skin: Positive for color change. Negative for wound.  All other systems reviewed and are negative.    Physical Exam Triage Vital Signs ED Triage Vitals  Enc Vitals Group     BP 01/08/20 1645 125/82     Pulse Rate 01/08/20 1645 82     Resp 01/08/20 1645 18     Temp 01/08/20 1645 98.5 F (36.9 C)     Temp Source 01/08/20 1645 Oral     SpO2 01/08/20 1645 100 %     Weight 01/08/20 1646 196 lb 6.4 oz (89.1 kg)     Height --      Head Circumference --      Peak Flow --      Pain Score 01/08/20 1645 10     Pain Loc --       Pain Edu? --      Excl. in [place]? --    No data found.  Updated Vital Signs BP 125/82 (BP Location: Right Arm)   Pulse 82   Temp 98.5 F (36.9 C) (Oral)   Resp 18   Wt 196 lb 6.4 oz (89.1 kg)   LMP 11/27/2016   SpO2 100%   BMI 34.79 kg/m   Visual Acuity Right Eye Distance: 20/30 Left Eye Distance: 20/40 Bilateral Distance: 20/25  Right Eye Near:   Left Eye Near:    Bilateral Near:     Physical Exam Vitals and nursing note reviewed.  Constitutional:      General: She is not in acute distress.    Appearance: Normal appearance. She is well-developed. She is not ill-appearing.  HENT:     Head: Normocephalic and atraumatic.  Eyes:     General: No scleral icterus.       Left eye: Hordeolum (with significant eyelid erythema and swelling) present.No foreign body or discharge.     Extraocular Movements: Extraocular movements intact.     Conjunctiva/sclera: Conjunctivae normal.     Left eye: Left conjunctiva is not injected. No chemosis, exudate or hemorrhage.    Comments: She is very tender on exam over the Left eye lid  Cardiovascular:     Rate and Rhythm: Normal rate.  Pulmonary:     Effort: Pulmonary effort is normal. No respiratory distress.  Musculoskeletal:     Cervical back: Neck supple.  Skin:    General: Skin is warm and dry.  Neurological:     General: No focal deficit present.     Mental Status: She is alert and oriented to person, place, and time.  Psychiatric:        Mood and Affect: Mood normal.        Behavior: Behavior normal.        Thought Content: Thought content normal.        Judgment: Judgment normal.      UC Treatments / Results  Labs (all labs ordered are listed, but only abnormal results are displayed) Labs Reviewed - No data to display  EKG   Radiology No results found.  Procedures Procedures (including critical care time)  Medications Ordered in UC Medications - No data to display  Initial Impression / Assessment and Plan /  UC Course  I have reviewed the triage vital signs and the nursing notes.  Pertinent labs & imaging results that were available during my care of the patient were reviewed by me and considered in my medical decision making (see  chart for details).     #[place] with pre-septal cellulitis Given this has been progressing with both pain and eye lid swelling, believe this a secondary preseptal infection.  - bactrim started and erythromycin ointment given - continue compresses - if no improvement by Monday, instructed to follow up with ophthalmology    Final Clinical Impressions(s) / UC Diagnoses   Final diagnoses:  None   Discharge Instructions   None    ED Prescriptions    None     PDMP not reviewed this encounter.   Purnell Shoemaker, PA-C 01/08/20 1742

## 2020-01-08 NOTE — ED Triage Notes (Signed)
Pt is here with an issue in her left eye that started a week ago. Pt has used stye meds to relieve discomfort.

## 2020-01-12 ENCOUNTER — Other Ambulatory Visit: Payer: Self-pay

## 2020-01-12 ENCOUNTER — Ambulatory Visit (INDEPENDENT_AMBULATORY_CARE_PROVIDER_SITE_OTHER): Payer: 59 | Admitting: Physical Therapy

## 2020-01-12 DIAGNOSIS — R2689 Other abnormalities of gait and mobility: Secondary | ICD-10-CM | POA: Diagnosis not present

## 2020-01-12 DIAGNOSIS — M25572 Pain in left ankle and joints of left foot: Secondary | ICD-10-CM

## 2020-01-12 DIAGNOSIS — H0014 Chalazion left upper eyelid: Secondary | ICD-10-CM | POA: Diagnosis not present

## 2020-01-12 DIAGNOSIS — M25672 Stiffness of left ankle, not elsewhere classified: Secondary | ICD-10-CM

## 2020-01-12 NOTE — Therapy (Signed)
[facility] Physical Therapy [address] [place], [place], [address] Phone: [phone]   Fax:  [phone]  Physical Therapy Evaluation  Patient Details  Name: Rachel Graham MRN: [id number] Date of Birth: Jul 19, 1971 Referring Provider (PT): Persons, Bevely Palmer, [place]   Encounter Date: 01/12/2020  PT End of Session - 01/12/20 2032    Visit Number  1    Number of Visits  18    Date for PT Re-Evaluation  03/08/20    Authorization Type  [facility]    PT Start Time  0935    PT Stop Time  1015    PT Time Calculation (min)  40 min    Activity Tolerance  Patient tolerated treatment well    Behavior During Therapy  [facility] for tasks assessed/performed       Past Medical History:  Diagnosis Date  . Blood in stool   . Contact lens/glasses fitting    wears glases or contacts    Past Surgical History:  Procedure Laterality Date  . DIAGNOSTIC LAPAROSCOPY  2010   lt ovarian cyst-LOA  . fibroids surgery  2008   d/c hysteroscopy  . GANGLION CYST EXCISION Right 04/27/2014   Procedure: RIGHT WRIST EXCISION OF MASS ;  Surgeon: Tennis Must, MD;  Location: [place];  Service: Orthopedics;  Laterality: Right;  . INCISION AND DRAINAGE ABSCESS     buttocks- "boil"  . ORIF ANKLE FRACTURE Left 12/04/2019   Procedure: OPEN REDUCTION INTERNAL FIXATION (ORIF) LEFT BIMALLEOLAR ANKLE FRACTURE;  Surgeon: Newt Minion, MD;  Location: [place];  Service: Orthopedics;  Laterality: Left;    There were no vitals filed for this visit.   Subjective Assessment - 01/12/20 0939    Subjective  Relays she tripped over a bottle of water in January and fell. She had Lt ankle ORIF 12/04/19.    Pertinent History  S/P ORIF 12/04/19, WBAT and wean out of boot.    Limitations  Lifting;Standing;Walking;House hold activities    How long can you stand comfortably?  5-10 min    Diagnostic tests  most recent XR 01/05/20 "demonstrate well-maintained alignment through the mortise of the  ankle.  Hardware is intact and in place there does appear to be some interval healing"    Patient Stated Goals  get back to normal with walking and ADL's    Currently in Pain?  Yes    Pain Score  5     Pain Location  Ankle    Pain Orientation  Left    Pain Descriptors / Indicators  Aching    Pain Type  Surgical pain    Pain Radiating Towards  denies    Pain Onset  More than a month ago    Pain Frequency  Intermittent    Aggravating Factors   prolonged standing or walking    Pain Relieving Factors  rest         [facility] PT Assessment - 01/12/20 0001      Assessment   Medical Diagnosis  Pain Lt ankle and Lt foot S/P ORIF 1/8/2    Referring Provider (PT)  Persons, Bevely Palmer, [place]    Onset Date/Surgical Date  12/04/19    Next MD Visit  02/02/20    Prior Therapy  none      Precautions   Precautions  Fall      Restrictions   Other Position/Activity Restrictions  WBAT, wean from CAM boot as tolerated      Balance Screen  Has the patient fallen in the past 6 months  Yes    How many times?  1    Has the patient had a decrease in activity level because of a fear of falling?   Yes    Is the patient reluctant to leave their home because of a fear of falling?   No      Home Film/video editor residence    Additional Comments  one step to enter      Prior Function   Level of Independence  Independent      Cognition   Overall Cognitive Status  Within Functional Limits for tasks assessed      Observation/Other Assessments   Observations  moderate edema in Lt ankle, incision sites well healing with no signs of infection      Sensation   Light Touch  Appears Intact      ROM / Strength   AROM / PROM / Strength  AROM;Strength;PROM      AROM   AROM Assessment Site  Ankle    Right/Left Ankle  Left    Left Ankle Dorsiflexion  3    Left Ankle Plantar Flexion  20    Left Ankle Inversion  5    Left Ankle Eversion  5      PROM   PROM Assessment Site  Ankle     Right/Left Ankle  Left    Left Ankle Dorsiflexion  5    Left Ankle Plantar Flexion  30    Left Ankle Inversion  20    Left Ankle Eversion  20      Strength   Overall Strength Comments  overall Lt ankle strength 4/5 MMT      Palpation   Palpation comment  TTP around incision at lateral and medial ankle      Transfers   Transfers  Independent with all Transfers      Ambulation/Gait   Gait Comments  ambulates with bilat crutches with antalgic gait on Lt with decreased step length, decreased dorsiflexion, and decreaesd heel/toe rockers                Objective measurements completed on examination: See above findings.      [place] [facility] Adult PT Treatment/Exercise - 01/12/20 0001      Exercises   Exercises  Ankle      Manual Therapy   Manual therapy comments  Lt ankle PROM all planes      Ankle Exercises: Stretches   Gastroc Stretch  2 reps;30 seconds    Gastroc Stretch Limitations  long sitting with strap      Ankle Exercises: Standing   Other Standing Ankle Exercises  weight shifting latreal then anterior posterior in staggered stance X 10 ea with bilat UE support      Ankle Exercises: Supine   Other Supine Ankle Exercises  ankle circles X 10, ankle alphabet X 1             PT Education - 01/12/20 2031    Education Details  HEP, POC    Person(s) Educated  Patient    Methods  Explanation;Demonstration;Verbal cues;Handout    Comprehension  Verbalized understanding;Need further instruction       PT Short Term Goals - 01/12/20 2040      PT SHORT TERM GOAL #1   Title  Pt will be I and compliant with HEP.    Time  4    Period  Weeks  Target Date  02/09/20        PT Long Term Goals - 01/12/20 2041      PT LONG TERM GOAL #1   Title  Pt will improve Lt ankle ROM to [facility]. (Target for all goals 2 months 03/08/20)    Status  New      PT LONG TERM GOAL #2   Title  Pt will improve Lt ankle strength to 5/5 to improve function    Status  New      PT LONG  TERM GOAL #3   Title  Pt will be able to ambulate  community distances [facility] gait pattern.    Status  New      PT LONG TERM GOAL #4   Title  Pt will be able to perform at least 30 minutes of standing ADL's with overall less than 3/10 pain    Status  New             Plan - 01/12/20 2033    Clinical Impression Statement  Pt presents with Pain in Lt ankle and Lt foot S/P ORIF 12/04/19. She is WBAT and can wean out of boot as able. She has overall decreased ankle ROM, decreased strength, decreased standing and walking tolerance, abnormal gait, and increased pain limiting her function. She will benefit from skilled PT to address her deficits.    Examination-Activity Limitations  Carry;Stairs;Squat;Transfers;Stand;Lift    Examination-Participation Restrictions  Cleaning;Community Activity;Driving;Shop;Laundry    Stability/Clinical Decision Making  Evolving/Moderate complexity    Clinical Decision Making  Moderate    Rehab Potential  Good    PT Frequency  2x / week    PT Duration  8 weeks    PT Treatment/Interventions  ADLs/Self Care Home Management;Aquatic Therapy;Cryotherapy;Electrical Stimulation;Iontophoresis 4mg /ml Dexamethasone;Moist Heat;Ultrasound;Gait training;Stair training;Therapeutic activities;Therapeutic exercise;Balance training;Neuromuscular re-education;Patient/family education;Manual techniques;Passive range of motion;Dry needling;Taping;Joint Manipulations    PT Next Visit Plan  review and update HEP PRN, needs ankle ROM, progress WBAT, gait, wean out of boot as able    PT Home Exercise Plan  Access Code: [email or web address]    Consulted and Agree with Plan of Care  Patient       Patient will benefit from skilled therapeutic intervention in order to improve the following deficits and impairments:  Abnormal gait, Decreased activity tolerance, Difficulty walking, Decreased strength, Hypomobility, Impaired flexibility, Pain  Visit Diagnosis: Pain in left ankle and joints of left  foot  Stiffness of left ankle, not elsewhere classified  Other abnormalities of gait and mobility     Problem List Patient Active Problem List   Diagnosis Date Noted  . Bimalleolar ankle fracture, left, closed, initial encounter   . Allergic reaction 06/16/2019  . Hyperglycemia 06/16/2019  . Postmenopausal symptoms 01/20/2019  . Viral URI with cough 01/20/2019  . Patellofemoral syndrome of both knees 09/09/2017  . Osteochondroma 05/31/2015  . Allergic rhinitis 03/24/2015  . Obesity 02/25/2015  . Routine general medical examination at a health care facility 02/25/2015    Silvestre Mesi 01/12/2020, 8:45 PM  [facility] Physical Therapy [address] [place], [place], [address] Phone: [phone]   Fax:  [phone]  Name: Rachel Graham MRN: [id number] Date of Birth: 28-Jul-1971

## 2020-01-12 NOTE — Patient Instructions (Signed)
Access Code: [email or web address]  URL: [email or web address]  Date: 01/12/2020  Prepared by: Elsie Ra   Exercises  Seated Calf Stretch with Strap - 3 reps - 1 sets - 30 hold - 2x daily - 6x weekly  Seated Heel Toe Raises - 10 reps - 3 sets - 2x daily - 6x weekly  Ankle Alphabet in Elevation - 10 reps - 3 sets - 2x daily - 6x weekly  Standing Weight Shift Side to Side - 10 reps - 3 sets - 2x daily - 6x weekly  Stride Stance Weight Shift - 10 reps - 3 sets - 2x daily - 6x weekly  Seated Ankle Circles - 10 reps - 3 sets - 2x daily - 6x weekly

## 2020-01-14 ENCOUNTER — Encounter: Payer: 59 | Admitting: Physical Therapy

## 2020-01-19 ENCOUNTER — Other Ambulatory Visit: Payer: Self-pay

## 2020-01-19 ENCOUNTER — Ambulatory Visit (INDEPENDENT_AMBULATORY_CARE_PROVIDER_SITE_OTHER): Payer: 59 | Admitting: Physical Therapy

## 2020-01-19 ENCOUNTER — Telehealth: Payer: Self-pay

## 2020-01-19 DIAGNOSIS — M25672 Stiffness of left ankle, not elsewhere classified: Secondary | ICD-10-CM

## 2020-01-19 DIAGNOSIS — R2689 Other abnormalities of gait and mobility: Secondary | ICD-10-CM | POA: Diagnosis not present

## 2020-01-19 DIAGNOSIS — M25572 Pain in left ankle and joints of left foot: Secondary | ICD-10-CM | POA: Diagnosis not present

## 2020-01-19 NOTE — Telephone Encounter (Signed)
This is ok per Junie Panning

## 2020-01-19 NOTE — Therapy (Signed)
[facility] Physical Therapy [address] [place], [place], [address] Phone: [phone]   Fax:  [phone]  Physical Therapy Treatment  Patient Details  Name: Rachel Graham MRN: [id number] Date of Birth: 1971/09/22 Referring Provider (PT): Persons, Bevely Palmer, [place]   Encounter Date: 01/19/2020  PT End of Session - 01/19/20 0948    Visit Number  2    Number of Visits  18    Date for PT Re-Evaluation  03/08/20    Authorization Type  [facility]    PT Start Time  0850    PT Stop Time  0930    PT Time Calculation (min)  40 min    Activity Tolerance  Patient tolerated treatment well    Behavior During Therapy  [facility] for tasks assessed/performed       Past Medical History:  Diagnosis Date  . Blood in stool   . Contact lens/glasses fitting    wears glases or contacts    Past Surgical History:  Procedure Laterality Date  . DIAGNOSTIC LAPAROSCOPY  2010   lt ovarian cyst-LOA  . fibroids surgery  2008   d/c hysteroscopy  . GANGLION CYST EXCISION Right 04/27/2014   Procedure: RIGHT WRIST EXCISION OF MASS ;  Surgeon: Tennis Must, MD;  Location: [place];  Service: Orthopedics;  Laterality: Right;  . INCISION AND DRAINAGE ABSCESS     buttocks- "boil"  . ORIF ANKLE FRACTURE Left 12/04/2019   Procedure: OPEN REDUCTION INTERNAL FIXATION (ORIF) LEFT BIMALLEOLAR ANKLE FRACTURE;  Surgeon: Newt Minion, MD;  Location: [place];  Service: Orthopedics;  Laterality: Left;    There were no vitals filed for this visit.  Subjective Assessment - 01/19/20 0946    Subjective  ankle pain is controlled today about 2-3/10., she is not using boot in house now and not using crutches anymore    Pertinent History  S/P ORIF 12/04/19, WBAT and wean out of boot.    Limitations  Lifting;Standing;Walking;House hold activities    How long can you stand comfortably?  5-10 min    Diagnostic tests  most recent XR 01/05/20 "demonstrate well-maintained alignment through the  mortise of the ankle.  Hardware is intact and in place there does appear to be some interval healing"    Patient Stated Goals  get back to normal with walking and ADL's    Pain Onset  More than a month ago                       [facility] Adult PT Treatment/Exercise - 01/19/20 0001      Ambulation/Gait   Gait Comments  50 ft X 2 without boot, no AD, but still has limp due to weakness and ROM limitations      Manual Therapy   Manual therapy comments  Lt ankle PROM all planes, gentle ankle distraction mobs and DF mobs grade 1-2      Ankle Exercises: Aerobic   Nustep  7 min L3      Ankle Exercises: Seated   Towel Crunch  --   25 reps   BAPS  Level 1;Sitting   20 reps each plane and circles     Ankle Exercises: Supine   T-Band  L2 X 20 reps each plane      Ankle Exercises: Stretches   Slant Board Stretch  3 reps;30 seconds      Ankle Exercises: Standing   Other Standing Ankle Exercises  weight shifting on airex  pad X 20 reps lateral and A-P    Other Standing Ankle Exercises  balance on airex pad feet together 30 sec X 2               PT Short Term Goals - 01/12/20 2040      PT SHORT TERM GOAL #1   Title  Pt will be I and compliant with HEP.    Time  4    Period  Weeks    Target Date  02/09/20        PT Long Term Goals - 01/12/20 2041      PT LONG TERM GOAL #1   Title  Pt will improve Lt ankle ROM to [facility]. (Target for all goals 2 months 03/08/20)    Status  New      PT LONG TERM GOAL #2   Title  Pt will improve Lt ankle strength to 5/5 to improve function    Status  New      PT LONG TERM GOAL #3   Title  Pt will be able to ambulate  community distances [facility] gait pattern.    Status  New      PT LONG TERM GOAL #4   Title  Pt will be able to perform at least 30 minutes of standing ADL's with overall less than 3/10 pain    Status  New            Plan - 01/19/20 [id number]    Clinical Impression Statement  able to progress her strenght and ROM  program for Lt ankle with good tolerance. Added in foam surface to further challenge ankle stability. Gave her red band to add ankle 4 way at home. Continue POC    Examination-Activity Limitations  Carry;Stairs;Squat;Transfers;Stand;Lift    Examination-Participation Restrictions  Cleaning;Community Activity;Driving;Shop;Laundry    Stability/Clinical Decision Making  Evolving/Moderate complexity    Rehab Potential  Good    PT Frequency  2x / week    PT Duration  8 weeks    PT Treatment/Interventions  ADLs/Self Care Home Management;Aquatic Therapy;Cryotherapy;Electrical Stimulation;Iontophoresis 4mg /ml Dexamethasone;Moist Heat;Ultrasound;Gait training;Stair training;Therapeutic activities;Therapeutic exercise;Balance training;Neuromuscular re-education;Patient/family education;Manual techniques;Passive range of motion;Dry needling;Taping;Joint Manipulations    PT Next Visit Plan  needs ankle ROM, progress WBAT, gait, wean out of boot as able    PT Home Exercise Plan  Access Code: [email or web address]    Consulted and Agree with Plan of Care  Patient       Patient will benefit from skilled therapeutic intervention in order to improve the following deficits and impairments:  Abnormal gait, Decreased activity tolerance, Difficulty walking, Decreased strength, Hypomobility, Impaired flexibility, Pain  Visit Diagnosis: Pain in left ankle and joints of left foot  Stiffness of left ankle, not elsewhere classified  Other abnormalities of gait and mobility     Problem List Patient Active Problem List   Diagnosis Date Noted  . Bimalleolar ankle fracture, left, closed, initial encounter   . Allergic reaction 06/16/2019  . Hyperglycemia 06/16/2019  . Postmenopausal symptoms 01/20/2019  . Viral URI with cough 01/20/2019  . Patellofemoral syndrome of both knees 09/09/2017  . Osteochondroma 05/31/2015  . Allergic rhinitis 03/24/2015  . Obesity 02/25/2015  . Routine general medical examination at a health  care facility 02/25/2015    Silvestre Mesi 01/19/2020, 9:50 AM  [facility] Physical Therapy [address] [place], [place], [address] Phone: [phone]   Fax:  [phone]  Name: Rachel Graham MRN: [id number] Date of Birth: 1971-01-23

## 2020-01-19 NOTE — Telephone Encounter (Signed)
Pt would like to extend her out of work for two more weeks

## 2020-01-19 NOTE — Telephone Encounter (Signed)
Note completed, signed and given to the pt.

## 2020-01-25 ENCOUNTER — Encounter: Payer: 59 | Admitting: Physical Therapy

## 2020-01-25 ENCOUNTER — Telehealth: Payer: Self-pay | Admitting: Orthopedic Surgery

## 2020-01-25 NOTE — Telephone Encounter (Signed)
Patient called requesting a extended work note from Dr Sharol Given. Patient stated job needs for new date for time off of work.Patient states work note also needs to be faxed [employer]) [place] fax number is [phone]. Please give patient call back. Patient phone number is [phone].

## 2020-01-26 ENCOUNTER — Other Ambulatory Visit: Payer: Self-pay

## 2020-01-26 NOTE — Telephone Encounter (Signed)
S/p ORIF ankle 12/04/19. Pt is requesting and updated work note. How much longer will she remain out of work ?

## 2020-01-26 NOTE — Telephone Encounter (Signed)
At least until after her next appointment

## 2020-01-26 NOTE — Telephone Encounter (Signed)
I called and sw pt to advise that a note had been written to remain out of work till next visit as per message below. Pt states that she had been given a note at the time of her appointment to return on 02/15/20. Pt had been advised that I would make a correction to reflect what she had been given and will fax this to [employer].

## 2020-01-27 ENCOUNTER — Other Ambulatory Visit: Payer: Self-pay

## 2020-01-27 ENCOUNTER — Ambulatory Visit (INDEPENDENT_AMBULATORY_CARE_PROVIDER_SITE_OTHER): Payer: 59 | Admitting: Physical Therapy

## 2020-01-27 DIAGNOSIS — M25572 Pain in left ankle and joints of left foot: Secondary | ICD-10-CM

## 2020-01-27 DIAGNOSIS — R2689 Other abnormalities of gait and mobility: Secondary | ICD-10-CM | POA: Diagnosis not present

## 2020-01-27 DIAGNOSIS — M25672 Stiffness of left ankle, not elsewhere classified: Secondary | ICD-10-CM

## 2020-01-27 NOTE — Therapy (Signed)
[facility] Physical Therapy [address] [place], [place], [address] Phone: [phone]   Fax:  [phone]  Physical Therapy Treatment  Patient Details  Name: Rachel Graham MRN: [id number] Date of Birth: 1971-05-10 Referring Provider (PT): Persons, Bevely Palmer, [place]   Encounter Date: 01/27/2020  PT End of Session - 01/27/20 1020    Visit Number  3    Number of Visits  18    Date for PT Re-Evaluation  03/08/20    Authorization Type  [facility]    PT Start Time  0930    PT Stop Time  1015    PT Time Calculation (min)  45 min    Activity Tolerance  Patient tolerated treatment well    Behavior During Therapy  [facility] for tasks assessed/performed       Past Medical History:  Diagnosis Date  . Blood in stool   . Contact lens/glasses fitting    wears glases or contacts    Past Surgical History:  Procedure Laterality Date  . DIAGNOSTIC LAPAROSCOPY  2010   lt ovarian cyst-LOA  . fibroids surgery  2008   d/c hysteroscopy  . GANGLION CYST EXCISION Right 04/27/2014   Procedure: RIGHT WRIST EXCISION OF MASS ;  Surgeon: Tennis Must, MD;  Location: [place];  Service: Orthopedics;  Laterality: Right;  . INCISION AND DRAINAGE ABSCESS     buttocks- "boil"  . ORIF ANKLE FRACTURE Left 12/04/2019   Procedure: OPEN REDUCTION INTERNAL FIXATION (ORIF) LEFT BIMALLEOLAR ANKLE FRACTURE;  Surgeon: Newt Minion, MD;  Location: [place];  Service: Orthopedics;  Laterality: Left;    There were no vitals filed for this visit.  Subjective Assessment - 01/27/20 0935    Subjective  ankle pain is controlled today about 2-3/10, no new complaints    Pertinent History  S/P ORIF 12/04/19, WBAT and wean out of boot.    Limitations  Lifting;Standing;Walking;House hold activities    How long can you stand comfortably?  5-10 min    Diagnostic tests  most recent XR 01/05/20 "demonstrate well-maintained alignment through the mortise of the ankle.  Hardware is intact and in place  there does appear to be some interval healing"    Patient Stated Goals  get back to normal with walking and ADL's    Pain Onset  More than a month ago                       [facility] Adult PT Treatment/Exercise - 01/27/20 0001      Ambulation/Gait   Gait Comments  50 ft X 2 without boot, no AD, but still has limp due to weakness and ROM limitations      Manual Therapy   Manual therapy comments  Lt ankle PROM all planes, gentle ankle distraction mobs and DF mobs grade 2      Ankle Exercises: Aerobic   Recumbent Bike  5 min L1    Nustep  5 min L5      Ankle Exercises: Seated   BAPS  Level 2;Sitting;15 reps      Ankle Exercises: Supine   T-Band  L2 X 20 reps each plane      Ankle Exercises: Stretches   Slant Board Stretch  3 reps;30 seconds    Other Stretch  DF lunge stretch with Lt foot on slantboard 5 sec X 15 reps      Ankle Exercises: Standing   Other Standing Ankle Exercises  tandem stance  30 sec X 2 bilat    Other Standing Ankle Exercises  step ups fwd and latera onto airex and back down for Lt leg X 10 reps               PT Short Term Goals - 01/12/20 2040      PT SHORT TERM GOAL #1   Title  Pt will be I and compliant with HEP.    Time  4    Period  Weeks    Target Date  02/09/20        PT Long Term Goals - 01/12/20 2041      PT LONG TERM GOAL #1   Title  Pt will improve Lt ankle ROM to [facility]. (Target for all goals 2 months 03/08/20)    Status  New      PT LONG TERM GOAL #2   Title  Pt will improve Lt ankle strength to 5/5 to improve function    Status  New      PT LONG TERM GOAL #3   Title  Pt will be able to ambulate  community distances [facility] gait pattern.    Status  New      PT LONG TERM GOAL #4   Title  Pt will be able to perform at least 30 minutes of standing ADL's with overall less than 3/10 pain    Status  New            Plan - 01/27/20 1022    Clinical Impression Statement  She had good tolerance to exericse  progression today to standing activity. She is improving in overall ankle ROM and strength but still having some deficits in these areas. She still has some concerns about ambulating in regular shoe vs boot so she was encouraged to bring her shoe in next session so PT can assess.    Examination-Activity Limitations  Carry;Stairs;Squat;Transfers;Stand;Lift    Examination-Participation Restrictions  Cleaning;Community Activity;Driving;Shop;Laundry    Stability/Clinical Decision Making  Evolving/Moderate complexity    Rehab Potential  Good    PT Frequency  2x / week    PT Duration  8 weeks    PT Treatment/Interventions  ADLs/Self Care Home Management;Aquatic Therapy;Cryotherapy;Electrical Stimulation;Iontophoresis 4mg /ml Dexamethasone;Moist Heat;Ultrasound;Gait training;Stair training;Therapeutic activities;Therapeutic exercise;Balance training;Neuromuscular re-education;Patient/family education;Manual techniques;Passive range of motion;Dry needling;Taping;Joint Manipulations    PT Next Visit Plan  needs ankle ROM, progress WBAT, gait, wean out of boot as able    PT Home Exercise Plan  Access Code: [email or web address]    Consulted and Agree with Plan of Care  Patient       Patient will benefit from skilled therapeutic intervention in order to improve the following deficits and impairments:  Abnormal gait, Decreased activity tolerance, Difficulty walking, Decreased strength, Hypomobility, Impaired flexibility, Pain  Visit Diagnosis: Pain in left ankle and joints of left foot  Stiffness of left ankle, not elsewhere classified  Other abnormalities of gait and mobility     Problem List Patient Active Problem List   Diagnosis Date Noted  . Bimalleolar ankle fracture, left, closed, initial encounter   . Allergic reaction 06/16/2019  . Hyperglycemia 06/16/2019  . Postmenopausal symptoms 01/20/2019  . Viral URI with cough 01/20/2019  . Patellofemoral syndrome of both knees 09/09/2017  . Osteochondroma  05/31/2015  . Allergic rhinitis 03/24/2015  . Obesity 02/25/2015  . Routine general medical examination at a health care facility 02/25/2015    Debbe Odea, PT,DPT 01/27/2020, 10:25 AM  [facility] Physical Therapy [phone]  [place], [place], [address] Phone: [phone]   Fax:  [phone]  Name: LOVELLE HAUSKNECHT MRN: [id number] Date of Birth: Sep 05, 1971

## 2020-02-01 ENCOUNTER — Other Ambulatory Visit: Payer: Self-pay

## 2020-02-01 ENCOUNTER — Ambulatory Visit: Payer: 59 | Admitting: Physical Therapy

## 2020-02-01 DIAGNOSIS — M25672 Stiffness of left ankle, not elsewhere classified: Secondary | ICD-10-CM

## 2020-02-01 DIAGNOSIS — M25572 Pain in left ankle and joints of left foot: Secondary | ICD-10-CM | POA: Diagnosis not present

## 2020-02-01 DIAGNOSIS — R2689 Other abnormalities of gait and mobility: Secondary | ICD-10-CM

## 2020-02-01 NOTE — Therapy (Signed)
[facility] Physical Therapy [address] [place], [place], [address] Phone: [phone]   Fax:  [phone]  Physical Therapy Treatment  Patient Details  Name: Rachel Graham MRN: [id number] Date of Birth: 01-Feb-1971 Referring Provider (PT): Persons, Bevely Palmer, [place]   Encounter Date: 02/01/2020  PT End of Session - 02/01/20 0819    Visit Number  4    Number of Visits  18    Date for PT Re-Evaluation  03/08/20    Authorization Type  [facility]    PT Start Time  0805    PT Stop Time  0845    PT Time Calculation (min)  40 min    Activity Tolerance  Patient tolerated treatment well    Behavior During Therapy  [facility] for tasks assessed/performed       Past Medical History:  Diagnosis Date  . Blood in stool   . Contact lens/glasses fitting    wears glases or contacts    Past Surgical History:  Procedure Laterality Date  . DIAGNOSTIC LAPAROSCOPY  2010   lt ovarian cyst-LOA  . fibroids surgery  2008   d/c hysteroscopy  . GANGLION CYST EXCISION Right 04/27/2014   Procedure: RIGHT WRIST EXCISION OF MASS ;  Surgeon: Tennis Must, MD;  Location: [place];  Service: Orthopedics;  Laterality: Right;  . INCISION AND DRAINAGE ABSCESS     buttocks- "boil"  . ORIF ANKLE FRACTURE Left 12/04/2019   Procedure: OPEN REDUCTION INTERNAL FIXATION (ORIF) LEFT BIMALLEOLAR ANKLE FRACTURE;  Surgeon: Newt Minion, MD;  Location: [place];  Service: Orthopedics;  Laterality: Left;    There were no vitals filed for this visit.  Subjective Assessment - 02/01/20 0818    Subjective  she is now walking without boot in regular sneaker, pain 2/10 in her ankle on avg    Pertinent History  S/P ORIF 12/04/19, WBAT and wean out of boot.    Limitations  Lifting;Standing;Walking;House hold activities    How long can you stand comfortably?  5-10 min    Diagnostic tests  most recent XR 01/05/20 "demonstrate well-maintained alignment through the mortise of the ankle.  Hardware is  intact and in place there does appear to be some interval healing"    Patient Stated Goals  get back to normal with walking and ADL's    Pain Onset  More than a month ago        [facility] Adult PT Treatment/Exercise - 02/01/20 0001      Manual Therapy   Manual therapy comments  Lt ankle PROM all planes, ankle distraction mobs, A-P and P-A mobs grade 2-3      Ankle Exercises: Aerobic   Recumbent Bike  5 min L1      Ankle Exercises: Supine   T-Band  L3 X 25 reps each plane      Ankle Exercises: Stretches   Slant Board Stretch  3 reps;30 seconds    Other Stretch  DF lunge stretch with Lt foot on slantboard 5 sec X 15 reps      Ankle Exercises: Standing   BAPS  Level 3;15 reps   Rt foot on floor, Lt on baps, all planes, circles   Other Standing Ankle Exercises  tandem stance 30 sec X 2 bilat on foam, heel toe raises X 15 ea on foam    Other Standing Ankle Exercises  step ups fwd and lateral for Lt X 15 ea on 4 inch step, stepping over 1/2 roll and  back  X10 reps bilat      Ankle Exercises: Seated   BAPS  --   progressed to standing today       PT Short Term Goals - 01/12/20 2040      PT SHORT TERM GOAL #1   Title  Pt will be I and compliant with HEP.    Time  4    Period  Weeks    Target Date  02/09/20        PT Long Term Goals - 01/12/20 2041      PT LONG TERM GOAL #1   Title  Pt will improve Lt ankle ROM to [facility]. (Target for all goals 2 months 03/08/20)    Status  New      PT LONG TERM GOAL #2   Title  Pt will improve Lt ankle strength to 5/5 to improve function    Status  New      PT LONG TERM GOAL #3   Title  Pt will be able to ambulate  community distances [facility] gait pattern.    Status  New      PT LONG TERM GOAL #4   Title  Pt will be able to perform at least 30 minutes of standing ADL's with overall less than 3/10 pain    Status  New            Plan - 02/01/20 0820    Clinical Impression Statement  She has now progressed out of walking boot however  still has limp due to ankle stiffness and weakness. PT will continue to address these deficits as tolerated.    Examination-Activity Limitations  Carry;Stairs;Squat;Transfers;Stand;Lift    Examination-Participation Restrictions  Cleaning;Community Activity;Driving;Shop;Laundry    Stability/Clinical Decision Making  Evolving/Moderate complexity    Rehab Potential  Good    PT Frequency  2x / week    PT Duration  8 weeks    PT Treatment/Interventions  ADLs/Self Care Home Management;Aquatic Therapy;Cryotherapy;Electrical Stimulation;Iontophoresis 4mg /ml Dexamethasone;Moist Heat;Ultrasound;Gait training;Stair training;Therapeutic activities;Therapeutic exercise;Balance training;Neuromuscular re-education;Patient/family education;Manual techniques;Passive range of motion;Dry needling;Taping;Joint Manipulations    PT Next Visit Plan  needs ankle ROM, progress WBAT, gait, wean out of boot as able    PT Home Exercise Plan  Access Code: [email or web address]    Consulted and Agree with Plan of Care  Patient       Patient will benefit from skilled therapeutic intervention in order to improve the following deficits and impairments:  Abnormal gait, Decreased activity tolerance, Difficulty walking, Decreased strength, Hypomobility, Impaired flexibility, Pain  Visit Diagnosis: Pain in left ankle and joints of left foot  Stiffness of left ankle, not elsewhere classified  Other abnormalities of gait and mobility     Problem List Patient Active Problem List   Diagnosis Date Noted  . Bimalleolar ankle fracture, left, closed, initial encounter   . Allergic reaction 06/16/2019  . Hyperglycemia 06/16/2019  . Postmenopausal symptoms 01/20/2019  . Viral URI with cough 01/20/2019  . Patellofemoral syndrome of both knees 09/09/2017  . Osteochondroma 05/31/2015  . Allergic rhinitis 03/24/2015  . Obesity 02/25/2015  . Routine general medical examination at a health care facility 02/25/2015    Debbe Odea,  PT,DPT 02/01/2020, 8:52 AM  [facility] Physical Therapy [address] [place], [place], [address] Phone: [phone]   Fax:  [phone]  Name: Rachel Graham MRN: [id number] Date of Birth: [birth year]

## 2020-02-02 ENCOUNTER — Ambulatory Visit (INDEPENDENT_AMBULATORY_CARE_PROVIDER_SITE_OTHER): Payer: 59 | Admitting: Physician Assistant

## 2020-02-02 ENCOUNTER — Encounter: Payer: Self-pay | Admitting: Physician Assistant

## 2020-02-02 ENCOUNTER — Ambulatory Visit (INDEPENDENT_AMBULATORY_CARE_PROVIDER_SITE_OTHER): Payer: 59

## 2020-02-02 DIAGNOSIS — S82842A Displaced bimalleolar fracture of left lower leg, initial encounter for closed fracture: Secondary | ICD-10-CM

## 2020-02-02 NOTE — Progress Notes (Signed)
Office Visit Note   Patient: Rachel Graham           Date of Birth: September 07, 1971           MRN: [id number] Visit Date: 02/02/2020              Requested by: Hoyt Koch, MD [address] [place],  [place] [address] PCP: Hoyt Koch, MD  No chief complaint on file.     HPI: This is a pleasant woman who is a little over 2 months status post ORIF of a left bimalleolar ankle fracture she just weaned out of her cam walker boot over the weekend and is working with physical therapy.  Her pain is well controlled.  She still gets some swelling especially after prolonged weightbearing she is set to go back to work in a couple weeks  Assessment & Plan: Visit Diagnoses:  1. Bimalleolar ankle fracture, left, closed, initial encounter     Plan: She will continue to work with physical therapy.  Focusing on gait normalization now that she is out of her boot and strengthening.  She will follow-up in a few months if she feels there is no progress or limited progress.  She understands that she will continue to deal with swelling especially as she after she goes back to work and she should elevate and ice as she can  Follow-Up Instructions: No follow-ups on file.   Ortho Exam  Patient is alert, oriented, no adenopathy, well-dressed, normal affect, normal respiratory effort. Left ankle: Well-healed surgical incision.  Toes are warm and pink with brisk capillary refill compartments are soft and compressible CMS is intact ankle range of motion is mildly stiff but not painful no cellulitis mild soft tissue swelling  Imaging: XR Ankle Complete Left  Result Date: 02/02/2020 3 views of her left ankle demonstrate status post ORIF bimalleolar ankle fracture mild amount of soft tissue swelling well-maintained alignment hardware intact and in place fracture is reduced in anatomic position  No images are attached to the encounter.  Labs: Lab Results  Component Value Date   HGBA1C 5.7 08/29/2017   HGBA1C 5.6 02/25/2015   REPTSTATUS 01/03/2013 FINAL 12/31/2012   GRAMSTAIN  12/31/2012    RARE WBC PRESENT, PREDOMINANTLY MONONUCLEAR NO SQUAMOUS EPITHELIAL CELLS SEEN NO ORGANISMS SEEN   CULT FEW STAPHYLOCOCCUS SPECIES (COAGULASE NEGATIVE) 12/31/2012     Lab Results  Component Value Date   ALBUMIN 4.2 08/29/2017   ALBUMIN 4.3 03/14/2016   ALBUMIN 4.0 02/25/2015    No results found for: MG Lab Results  Component Value Date   VD25OH 48.93 08/29/2017   VD25OH 35.86 03/14/2016    No results found for: PREALBUMIN CBC EXTENDED Latest Ref Rng & Units 12/04/2019 08/29/2017 03/14/2016  WBC 4.0 - 10.5 K/uL 7.7 6.6 5.7  RBC 3.87 - 5.11 MIL/uL 4.26 4.21 4.19  HGB 12.0 - 15.0 g/dL 12.3 11.8(L) 11.9(L)  HCT 36.0 - 46.0 % 38.9 36.5 36.1  PLT 150 - 400 K/uL 251 270.0 248.0     There is no height or weight on file to calculate BMI.  Orders:  Orders Placed This Encounter  Procedures  . XR Ankle Complete Left   No orders of the defined types were placed in this encounter.    Procedures: No procedures performed  Clinical Data: No additional findings.  ROS:  All other systems negative, except as noted in the HPI. Review of Systems  Objective: Vital Signs: LMP 11/27/2016   Specialty Comments:  No specialty comments available.  PMFS History: Patient Active Problem List   Diagnosis Date Noted  . Bimalleolar ankle fracture, left, closed, initial encounter   . Allergic reaction 06/16/2019  . Hyperglycemia 06/16/2019  . Postmenopausal symptoms 01/20/2019  . Viral URI with cough 01/20/2019  . Patellofemoral syndrome of both knees 09/09/2017  . Osteochondroma 05/31/2015  . Allergic rhinitis 03/24/2015  . Obesity 02/25/2015  . Routine general medical examination at a health care facility 02/25/2015   Past Medical History:  Diagnosis Date  . Blood in stool   . Contact lens/glasses fitting    wears glases or contacts    Family History  Problem  Relation Age of Onset  . Diabetes Mother   . Diabetes Father   . Diabetes Sister   . Diabetes Brother     Past Surgical History:  Procedure Laterality Date  . DIAGNOSTIC LAPAROSCOPY  2010   lt ovarian cyst-LOA  . fibroids surgery  2008   d/c hysteroscopy  . GANGLION CYST EXCISION Right 04/27/2014   Procedure: RIGHT WRIST EXCISION OF MASS ;  Surgeon: Tennis Must, MD;  Location: [place];  Service: Orthopedics;  Laterality: Right;  . INCISION AND DRAINAGE ABSCESS     buttocks- "boil"  . ORIF ANKLE FRACTURE Left 12/04/2019   Procedure: OPEN REDUCTION INTERNAL FIXATION (ORIF) LEFT BIMALLEOLAR ANKLE FRACTURE;  Surgeon: Newt Minion, MD;  Location: [place];  Service: Orthopedics;  Laterality: Left;   Social History   Occupational History  . Not on file  Tobacco Use  . Smoking status: Never Smoker  . Smokeless tobacco: Never Used  Substance and Sexual Activity  . Alcohol use: Yes    Comment: occasional  . Drug use: No  . Sexual activity: Not Currently    Birth control/protection: None

## 2020-02-03 ENCOUNTER — Ambulatory Visit: Payer: 59 | Admitting: Physical Therapy

## 2020-02-03 ENCOUNTER — Other Ambulatory Visit: Payer: Self-pay

## 2020-02-03 DIAGNOSIS — M25572 Pain in left ankle and joints of left foot: Secondary | ICD-10-CM | POA: Diagnosis not present

## 2020-02-03 DIAGNOSIS — R2689 Other abnormalities of gait and mobility: Secondary | ICD-10-CM

## 2020-02-03 DIAGNOSIS — M25672 Stiffness of left ankle, not elsewhere classified: Secondary | ICD-10-CM

## 2020-02-03 NOTE — Therapy (Signed)
[facility] Physical Therapy [address] [place], [place], [address] Phone: [phone]   Fax:  [phone]  Physical Therapy Treatment  Patient Details  Name: Rachel Graham MRN: [id number] Date of Birth: 14-Jan-1971 Referring Provider (Rachel Graham): Persons, Bevely Palmer, [place]   Encounter Date: 02/03/2020  Rachel Graham End of Session - 02/03/20 0902    Visit Number  5    Number of Visits  18    Date for Rachel Graham Re-Evaluation  03/08/20    Authorization Type  [facility]    Rachel Graham Start Time  0805    Rachel Graham Stop Time  0846    Rachel Graham Time Calculation (min)  41 min    Activity Tolerance  Patient tolerated treatment well    Behavior During Therapy  [facility] for tasks assessed/performed       Past Medical History:  Diagnosis Date  . Blood in stool   . Contact lens/glasses fitting    wears glases or contacts    Past Surgical History:  Procedure Laterality Date  . DIAGNOSTIC LAPAROSCOPY  2010   lt ovarian cyst-LOA  . fibroids surgery  2008   d/c hysteroscopy  . GANGLION CYST EXCISION Right 04/27/2014   Procedure: RIGHT WRIST EXCISION OF MASS ;  Surgeon: Tennis Must, MD;  Location: [place];  Service: Orthopedics;  Laterality: Right;  . INCISION AND DRAINAGE ABSCESS     buttocks- "boil"  . ORIF ANKLE FRACTURE Left 12/04/2019   Procedure: OPEN REDUCTION INTERNAL FIXATION (ORIF) LEFT BIMALLEOLAR ANKLE FRACTURE;  Surgeon: Newt Minion, MD;  Location: [place];  Service: Orthopedics;  Laterality: Left;    There were no vitals filed for this visit.  Subjective Assessment - 02/03/20 0818    Subjective  pain in Lt ankle has been managable being out of walking boot 2/10 on avg    Pertinent History  S/P ORIF 12/04/19, WBAT and wean out of boot.    Limitations  Lifting;Standing;Walking;House hold activities    How long can you stand comfortably?  5-10 min    Diagnostic tests  most recent XR 01/05/20 "demonstrate well-maintained alignment through the mortise of the ankle.  Hardware is intact  and in place there does appear to be some interval healing"    Patient Stated Goals  get back to normal with walking and ADL's    Pain Onset  More than a month ago                       [facility] Adult Rachel Graham Treatment/Exercise - 02/03/20 0001      Manual Therapy   Manual therapy comments  Lt ankle PROM all planes, ankle distraction mobs, A-P and P-A mobs grade 2-3      Ankle Exercises: Aerobic   Nustep  6 min L5      Ankle Exercises: Supine   T-Band  L3 X 25 reps each plane      Ankle Exercises: Stretches   Slant Board Stretch  3 reps;30 seconds    Other Stretch  DF lunge stretch with Lt foot on slantboard 5 sec X 15 reps      Ankle Exercises: Standing   BAPS  Level 3;10 reps;Level 1    Other Standing Ankle Exercises  step ups fwd and lateral step up/over for Lt X 15 ea on 4 inch step, step downs from 2 inch step X 10 reps but unable to keep her heel down, on airex pad: heel toe raises 2X10, marches and  hip abd UE support PRN X 15 bilat               Rachel Graham Short Term Goals - 01/12/20 2040      Rachel Graham SHORT TERM GOAL #1   Title  Rachel Graham will be I and compliant with HEP.    Time  4    Period  Weeks    Target Date  02/09/20        Rachel Graham Long Term Goals - 01/12/20 2041      Rachel Graham LONG TERM GOAL #1   Title  Rachel Graham will improve Lt ankle ROM to [facility] (Target for all goals 2 months 03/08/20)    Status  New      Rachel Graham LONG TERM GOAL #2   Title  Rachel Graham will improve Lt ankle strength to 5/5 to improve function    Status  New      Rachel Graham LONG TERM GOAL #3   Title  Rachel Graham will be able to ambulate  community distances [facility] gait pattern.    Status  New      Rachel Graham LONG TERM GOAL #4   Title  Rachel Graham will be able to perform at least 30 minutes of standing ADL's with overall less than 3/10 pain    Status  New            Plan - 02/03/20 0903    Clinical Impression Statement  Progressed standing tolerance and ankle ROM as able today without complaints. She continues to be limited by ankle stiffness and  weakness. Continue current Rachel Graham POC.    Examination-Activity Limitations  Carry;Stairs;Squat;Transfers;Stand;Lift    Examination-Participation Restrictions  Cleaning;Community Activity;Driving;Shop;Laundry    Stability/Clinical Decision Making  Evolving/Moderate complexity    Rehab Potential  Good    Rachel Graham Frequency  2x / week    Rachel Graham Duration  8 weeks    Rachel Graham Treatment/Interventions  ADLs/Self Care Home Management;Aquatic Therapy;Cryotherapy;Electrical Stimulation;Iontophoresis 4mg /ml Dexamethasone;Moist Heat;Ultrasound;Gait training;Stair training;Therapeutic activities;Therapeutic exercise;Balance training;Neuromuscular re-education;Patient/family education;Manual techniques;Passive range of motion;Dry needling;Taping;Joint Manipulations    Rachel Graham Next Visit Plan  needs ankle ROM, progress WBAT and ankle stability    Rachel Graham Home Exercise Plan  Access Code: [email or web address]    Consulted and Agree with Plan of Care  Patient       Patient will benefit from skilled therapeutic intervention in order to improve the following deficits and impairments:  Abnormal gait, Decreased activity tolerance, Difficulty walking, Decreased strength, Hypomobility, Impaired flexibility, Pain  Visit Diagnosis: Pain in left ankle and joints of left foot  Stiffness of left ankle, not elsewhere classified  Other abnormalities of gait and mobility     Problem List Patient Active Problem List   Diagnosis Date Noted  . Bimalleolar ankle fracture, left, closed, initial encounter   . Allergic reaction 06/16/2019  . Hyperglycemia 06/16/2019  . Postmenopausal symptoms 01/20/2019  . Viral URI with cough 01/20/2019  . Patellofemoral syndrome of both knees 09/09/2017  . Osteochondroma 05/31/2015  . Allergic rhinitis 03/24/2015  . Obesity 02/25/2015  . Routine general medical examination at a health care facility 02/25/2015    Rachel Graham, Rachel Graham,Rachel Graham 02/03/2020, 9:06 [facility] [address] [place], [place], [address] Phone: [phone]   Fax:  [phone]  Name: Rachel Graham MRN: [id number] Date of Birth: 23-Dec-1970

## 2020-02-08 ENCOUNTER — Encounter: Payer: Self-pay | Admitting: Physical Therapy

## 2020-02-08 ENCOUNTER — Ambulatory Visit: Payer: 59 | Admitting: Physical Therapy

## 2020-02-08 ENCOUNTER — Other Ambulatory Visit: Payer: Self-pay

## 2020-02-08 DIAGNOSIS — M25672 Stiffness of left ankle, not elsewhere classified: Secondary | ICD-10-CM | POA: Diagnosis not present

## 2020-02-08 DIAGNOSIS — R2689 Other abnormalities of gait and mobility: Secondary | ICD-10-CM | POA: Diagnosis not present

## 2020-02-08 DIAGNOSIS — M25572 Pain in left ankle and joints of left foot: Secondary | ICD-10-CM

## 2020-02-08 NOTE — Therapy (Signed)
[facility] Therapy [address] [place], [place], [address] Phone: [phone]   Fax:  [phone]  Physical Therapy Treatment  Patient Details  Name: Rachel Graham MRN: [id number] Date of Birth: July 17, 1971 Referring Provider (PT): Persons, Bevely Palmer, [place]   Encounter Date: 02/08/2020  PT End of Session - 02/08/20 0833    Visit Number  6    Number of Visits  18    Date for PT Re-Evaluation  03/08/20    Authorization Type  [facility]    PT Start Time  0810    PT Stop Time  0848    PT Time Calculation (min)  38 min    Activity Tolerance  Patient tolerated treatment well    Behavior During Therapy  [facility] for tasks assessed/performed       Past Medical History:  Diagnosis Date  . Blood in stool   . Contact lens/glasses fitting    wears glases or contacts    Past Surgical History:  Procedure Laterality Date  . DIAGNOSTIC LAPAROSCOPY  2010   lt ovarian cyst-LOA  . fibroids surgery  2008   d/c hysteroscopy  . GANGLION CYST EXCISION Right 04/27/2014   Procedure: RIGHT WRIST EXCISION OF MASS ;  Surgeon: Tennis Must, MD;  Location: [place];  Service: Orthopedics;  Laterality: Right;  . INCISION AND DRAINAGE ABSCESS     buttocks- "boil"  . ORIF ANKLE FRACTURE Left 12/04/2019   Procedure: OPEN REDUCTION INTERNAL FIXATION (ORIF) LEFT BIMALLEOLAR ANKLE FRACTURE;  Surgeon: Newt Minion, MD;  Location: [place];  Service: Orthopedics;  Laterality: Left;    There were no vitals filed for this visit.  Subjective Assessment - 02/08/20 0819    Subjective  pain in Lt ankle has been managable being out of walking boot 1/10 on avg    Pertinent History  S/P ORIF 12/04/19, WBAT and wean out of boot.    Limitations  Lifting;Standing;Walking;House hold activities    How long can you stand comfortably?  5-10 min    Diagnostic tests  most recent XR 01/05/20 "demonstrate well-maintained alignment through the mortise of the ankle.  Hardware is intact  and in place there does appear to be some interval healing"    Patient Stated Goals  get back to normal with walking and ADL's    Pain Onset  More than a month ago         [facility] PT Assessment - 02/08/20 0001      Assessment   Medical Diagnosis  Pain Lt ankle and Lt foot S/P ORIF 1/8/2    Referring Provider (PT)  Persons, Bevely Palmer, [place]    Onset Date/Surgical Date  12/04/19                   [facility] Adult PT Treatment/Exercise - 02/08/20 0001      Manual Therapy   Manual therapy comments  Lt ankle PROM all planes, ankle distraction mobs, A-P and P-A mobs grade 2-3      Ankle Exercises: Aerobic   Nustep  6 min L5      Ankle Exercises: Supine   T-Band  L3 X 25 reps each plane      Ankle Exercises: Stretches   Slant Board Stretch  3 reps;30 seconds    Other Stretch  DF lunge stretch with Lt foot on slantboard 5 sec X 15 reps      Ankle Exercises: Standing   BAPS  Level 4;15 reps  standing, Rt foot on ground   Other Standing Ankle Exercises  SLS on Lt 30 sec X 3 reps    Other Standing Ankle Exercises  step ups fwd and lateral step up/over for Lt X 15 ea on 4 inch step, heel toe raises X15 ea off of edge of step,                PT Short Term Goals - 01/12/20 2040      PT SHORT TERM GOAL #1   Title  Pt will be I and compliant with HEP.    Time  4    Period  Weeks    Target Date  02/09/20        PT Long Term Goals - 01/12/20 2041      PT LONG TERM GOAL #1   Title  Pt will improve Lt ankle ROM to [facility]. (Target for all goals 2 months 03/08/20)    Status  New      PT LONG TERM GOAL #2   Title  Pt will improve Lt ankle strength to 5/5 to improve function    Status  New      PT LONG TERM GOAL #3   Title  Pt will be able to ambulate  community distances [facility] gait pattern.    Status  New      PT LONG TERM GOAL #4   Title  Pt will be able to perform at least 30 minutes of standing ADL's with overall less than 3/10 pain    Status  New             Plan - 02/08/20 [id number]    Clinical Impression Statement  no longer using walking boot but still has limp due to decreased ankle ROM and strength. PT will continue to progress this as able. She is doing well with pain.    Examination-Activity Limitations  Carry;Stairs;Squat;Transfers;Stand;Lift    Examination-Participation Restrictions  Cleaning;Community Activity;Driving;Shop;Laundry    Stability/Clinical Decision Making  Evolving/Moderate complexity    Rehab Potential  Good    PT Frequency  2x / week    PT Duration  8 weeks    PT Treatment/Interventions  ADLs/Self Care Home Management;Aquatic Therapy;Cryotherapy;Electrical Stimulation;Iontophoresis 4mg /ml Dexamethasone;Moist Heat;Ultrasound;Gait training;Stair training;Therapeutic activities;Therapeutic exercise;Balance training;Neuromuscular re-education;Patient/family education;Manual techniques;Passive range of motion;Dry needling;Taping;Joint Manipulations    PT Next Visit Plan  needs ankle ROM, progress WBAT and ankle stability    PT Home Exercise Plan  Access Code: [email or web address]    Consulted and Agree with Plan of Care  Patient       Patient will benefit from skilled therapeutic intervention in order to improve the following deficits and impairments:  Abnormal gait, Decreased activity tolerance, Difficulty walking, Decreased strength, Hypomobility, Impaired flexibility, Pain  Visit Diagnosis: Pain in left ankle and joints of left foot  Stiffness of left ankle, not elsewhere classified  Other abnormalities of gait and mobility     Problem List Patient Active Problem List   Diagnosis Date Noted  . Bimalleolar ankle fracture, left, closed, initial encounter   . Allergic reaction 06/16/2019  . Hyperglycemia 06/16/2019  . Postmenopausal symptoms 01/20/2019  . Viral URI with cough 01/20/2019  . Patellofemoral syndrome of both knees 09/09/2017  . Osteochondroma 05/31/2015  . Allergic rhinitis 03/24/2015  . Obesity  02/25/2015  . Routine general medical examination at a health care facility 02/25/2015    Debbe Odea, PT,DPT 02/08/2020, 8:58 AM  [facility] Physical Therapy [place], [place],  [id number] Phone: [phone]   Fax:  [phone]  Name: Rachel Graham MRN: [id number] Date of Birth: 05/25/1971

## 2020-02-10 ENCOUNTER — Telehealth: Payer: Self-pay | Admitting: Orthopedic Surgery

## 2020-02-10 ENCOUNTER — Other Ambulatory Visit: Payer: Self-pay

## 2020-02-10 ENCOUNTER — Ambulatory Visit: Payer: 59 | Admitting: Physical Therapy

## 2020-02-10 DIAGNOSIS — R2689 Other abnormalities of gait and mobility: Secondary | ICD-10-CM | POA: Diagnosis not present

## 2020-02-10 DIAGNOSIS — M25672 Stiffness of left ankle, not elsewhere classified: Secondary | ICD-10-CM | POA: Diagnosis not present

## 2020-02-10 DIAGNOSIS — M25572 Pain in left ankle and joints of left foot: Secondary | ICD-10-CM

## 2020-02-10 NOTE — Telephone Encounter (Signed)
I called pt to advise that her letter is ready for pick up and also ok to continue with physical therapy. Is there another referral that's needed for continuation of services or is this verbal ok?

## 2020-02-10 NOTE — Therapy (Signed)
[facility] Physical Therapy [address] [place], [place], [address] Phone: [phone]   Fax:  [phone]  Physical Therapy Treatment  Patient Details  Name: Rachel Graham MRN: [id number] Date of Birth: 11/25/1971 Referring Provider (PT): Persons, Bevely Palmer, [place]   Encounter Date: 02/10/2020  PT End of Session - 02/10/20 0807    Visit Number  7    Number of Visits  18    Date for PT Re-Evaluation  03/08/20    Authorization Type  [facility]    PT Start Time  0803    PT Stop Time  0845    PT Time Calculation (min)  42 min    Activity Tolerance  Patient tolerated treatment well    Behavior During Therapy  [facility] for tasks assessed/performed       Past Medical History:  Diagnosis Date  . Blood in stool   . Contact lens/glasses fitting    wears glases or contacts    Past Surgical History:  Procedure Laterality Date  . DIAGNOSTIC LAPAROSCOPY  2010   lt ovarian cyst-LOA  . fibroids surgery  2008   d/c hysteroscopy  . GANGLION CYST EXCISION Right 04/27/2014   Procedure: RIGHT WRIST EXCISION OF MASS ;  Surgeon: Tennis Must, MD;  Location: [place];  Service: Orthopedics;  Laterality: Right;  . INCISION AND DRAINAGE ABSCESS     buttocks- "boil"  . ORIF ANKLE FRACTURE Left 12/04/2019   Procedure: OPEN REDUCTION INTERNAL FIXATION (ORIF) LEFT BIMALLEOLAR ANKLE FRACTURE;  Surgeon: Newt Minion, MD;  Location: [place];  Service: Orthopedics;  Laterality: Left;    There were no vitals filed for this visit.  Subjective Assessment - 02/10/20 0808    Subjective  pain in Lt ankle has been managable being out of walking boot 1/10 on avg. She denies soreness after last session    Pertinent History  S/P ORIF 12/04/19, WBAT and wean out of boot.    Limitations  Lifting;Standing;Walking;House hold activities    How long can you stand comfortably?  5-10 min    Diagnostic tests  most recent XR 01/05/20 "demonstrate well-maintained alignment through the  mortise of the ankle.  Hardware is intact and in place there does appear to be some interval healing"    Patient Stated Goals  get back to normal with walking and ADL's    Pain Onset  More than a month ago         [facility] PT Assessment - 02/10/20 0001      Assessment   Medical Diagnosis  Pain Lt ankle and Lt foot S/P ORIF 1/8/2    Referring Provider (PT)  Persons, Bevely Palmer, [place]    Onset Date/Surgical Date  12/04/19      AROM   Left Ankle Dorsiflexion  3    Left Ankle Plantar Flexion  30    Left Ankle Inversion  10    Left Ankle Eversion  10            [facility] Adult PT Treatment/Exercise - 02/10/20 0001      Manual Therapy   Manual therapy comments  Lt ankle PROM all planes, ankle distraction mobs, A-P and P-A mobs grade 2-3      Ankle Exercises: Aerobic   Recumbent Bike  6 min L1      Ankle Exercises: Supine   T-Band  L3 X 25 reps each plane      Ankle Exercises: Stretches   Set designer  4 reps;30 seconds   2 reps gastroc, 2 reps soleus     Ankle Exercises: Standing   Other Standing Ankle Exercises  step ups fwd and step downs on 6 inch step X 10 ea, heel toe raises 2X10  off of edge of step, stepping over hurldles lateral and fwd, tandem walk      Ankle Exercises: Machines for Strengthening   Cybex Leg Press  calf raises on plyosled 25 lbs 2X15 reps         PT Short Term Goals - 01/12/20 2040      PT SHORT TERM GOAL #1   Title  Pt will be I and compliant with HEP.    Time  4    Period  Weeks    Target Date  02/09/20        PT Long Term Goals - 02/10/20 0844      PT LONG TERM GOAL #1   Title  Pt will improve Lt ankle ROM to [facility]. (Target for all goals 2 months 03/08/20)    Status  On-going      PT LONG TERM GOAL #2   Title  Pt will improve Lt ankle strength to 5/5 to improve function    Status  On-going      PT LONG TERM GOAL #3   Title  Pt will be able to ambulate  community distances [facility] gait pattern.    Status  New      PT LONG TERM  GOAL #4   Title  Pt will be able to perform at least 30 minutes of standing ADL's with overall less than 3/10 pain    Status  On-going            Plan - 02/10/20 0843    Clinical Impression Statement  progressed ankle strength, standing tolernace, and ROM today with good tolerance and without complaints. She will continue to need PT to address these deficits and improve gait pattern.    Examination-Activity Limitations  Carry;Stairs;Squat;Transfers;Stand;Lift    Examination-Participation Restrictions  Cleaning;Community Activity;Driving;Shop;Laundry    Stability/Clinical Decision Making  Evolving/Moderate complexity    Rehab Potential  Good    PT Frequency  2x / week    PT Duration  8 weeks    PT Treatment/Interventions  ADLs/Self Care Home Management;Aquatic Therapy;Cryotherapy;Electrical Stimulation;Iontophoresis 4mg /ml Dexamethasone;Moist Heat;Ultrasound;Gait training;Stair training;Therapeutic activities;Therapeutic exercise;Balance training;Neuromuscular re-education;Patient/family education;Manual techniques;Passive range of motion;Dry needling;Taping;Joint Manipulations    PT Next Visit Plan  needs ankle ROM, progress WBAT and ankle stability    PT Home Exercise Plan  Access Code: [email or web address]    Consulted and Agree with Plan of Care  Patient       Patient will benefit from skilled therapeutic intervention in order to improve the following deficits and impairments:  Abnormal gait, Decreased activity tolerance, Difficulty walking, Decreased strength, Hypomobility, Impaired flexibility, Pain  Visit Diagnosis: Pain in left ankle and joints of left foot  Stiffness of left ankle, not elsewhere classified  Other abnormalities of gait and mobility     Problem List Patient Active Problem List   Diagnosis Date Noted  . Bimalleolar ankle fracture, left, closed, initial encounter   . Allergic reaction 06/16/2019  . Hyperglycemia 06/16/2019  . Postmenopausal symptoms 01/20/2019  .  Viral URI with cough 01/20/2019  . Patellofemoral syndrome of both knees 09/09/2017  . Osteochondroma 05/31/2015  . Allergic rhinitis 03/24/2015  . Obesity 02/25/2015  . Routine general medical examination at a health care facility 02/25/2015    Aaron Edelman  Ivory Broad, PT,DPT 02/10/2020, 8:48 AM  [facility] Physical Therapy [address] [place], [place], [address] Phone: [phone]   Fax:  [phone]  Name: Rachel Graham MRN: [id number] Date of Birth: July 26, 1971

## 2020-02-10 NOTE — Telephone Encounter (Signed)
Pt came in for a PT appt and wanted to know if Audrea Muscat could write her a note for work stating that for the next 2 weeks she can only work half a day and the pt also stated she would like to continue PT. The pt would like a call back with mary anns asnwer regarding the note and PT.   Home# [phone]  Cell# [phone]

## 2020-02-18 ENCOUNTER — Ambulatory Visit (INDEPENDENT_AMBULATORY_CARE_PROVIDER_SITE_OTHER): Payer: 59 | Admitting: Rehabilitative and Restorative Service Providers"

## 2020-02-18 ENCOUNTER — Other Ambulatory Visit: Payer: Self-pay

## 2020-02-18 ENCOUNTER — Encounter: Payer: Self-pay | Admitting: Rehabilitative and Restorative Service Providers"

## 2020-02-18 DIAGNOSIS — R2689 Other abnormalities of gait and mobility: Secondary | ICD-10-CM

## 2020-02-18 DIAGNOSIS — M25572 Pain in left ankle and joints of left foot: Secondary | ICD-10-CM | POA: Diagnosis not present

## 2020-02-18 DIAGNOSIS — M25672 Stiffness of left ankle, not elsewhere classified: Secondary | ICD-10-CM

## 2020-02-18 NOTE — Therapy (Signed)
[facility] Physical Therapy [address] [place], [place], [address] Phone: [phone]   Fax:  [phone]  Physical Therapy Treatment  Patient Details  Name: Rachel Graham MRN: [id number] Date of Birth: [birth year] Referring Provider (PT): Persons, Bevely Palmer, [place]   Encounter Date: 02/18/2020  PT End of Session - 02/18/20 1535    Visit Number  8    Number of Visits  18    Date for PT Re-Evaluation  03/08/20    Authorization Type  [facility]    PT Start Time  1530    PT Stop Time  1610    PT Time Calculation (min)  40 min    Activity Tolerance  Patient tolerated treatment well    Behavior During Therapy  [facility] for tasks assessed/performed       Past Medical History:  Diagnosis Date  . Blood in stool   . Contact lens/glasses fitting    wears glases or contacts    Past Surgical History:  Procedure Laterality Date  . DIAGNOSTIC LAPAROSCOPY  2010   lt ovarian cyst-LOA  . fibroids surgery  2008   d/c hysteroscopy  . GANGLION CYST EXCISION Right 04/27/2014   Procedure: RIGHT WRIST EXCISION OF MASS ;  Surgeon: Tennis Must, MD;  Location: [place];  Service: Orthopedics;  Laterality: Right;  . INCISION AND DRAINAGE ABSCESS     buttocks- "boil"  . ORIF ANKLE FRACTURE Left 12/04/2019   Procedure: OPEN REDUCTION INTERNAL FIXATION (ORIF) LEFT BIMALLEOLAR ANKLE FRACTURE;  Surgeon: Newt Minion, MD;  Location: [place];  Service: Orthopedics;  Laterality: Left;    There were no vitals filed for this visit.  Subjective Assessment - 02/18/20 1533    Subjective  Pt. indicated feeling no pain at this moment upon arrival.  Pt. reported feeling stiff at times and with walking.    Pertinent History  S/P ORIF 12/04/19, WBAT and wean out of boot.    Limitations  Lifting;Standing;Walking;House hold activities    How long can you stand comfortably?  5-10 min    Diagnostic tests  most recent XR 01/05/20 "demonstrate well-maintained alignment through the  mortise of the ankle.  Hardware is intact and in place there does appear to be some interval healing"    Patient Stated Goals  get back to normal with walking and ADL's    Currently in Pain?  No/denies    Pain Score  0-No pain    Pain Location  Ankle    Pain Orientation  Left    Pain Descriptors / Indicators  Aching    Pain Type  Surgical pain    Pain Onset  More than a month ago                       [facility] Adult PT Treatment/Exercise - 02/18/20 0001      Neuro Re-ed    Neuro Re-ed Details   tandem stance 30 sec x 3 bilat, retro step 20 x Lt LE posterior, fwd/rev ambulation c equal step size focus 15 ft x 5      Manual Therapy   Manual therapy comments  Lt ankle g3-g4 ap mobs, medial/lateral sub talar mobs.  MWM c DF 2 x 10      Ankle Exercises: Stretches   Slant Board Stretch  3 reps;30 seconds      Ankle Exercises: Supine   T-Band  Blue band inversion, eversion 20 x each  PT Education - 02/18/20 1534    Education Details  Intervention cues    Person(s) Educated  Patient    Methods  Explanation;Demonstration    Comprehension  Verbalized understanding;Returned demonstration       PT Short Term Goals - 01/12/20 2040      PT SHORT TERM GOAL #1   Title  Pt will be I and compliant with HEP.    Time  4    Period  Weeks    Target Date  02/09/20        PT Long Term Goals - 02/10/20 0844      PT LONG TERM GOAL #1   Title  Pt will improve Lt ankle ROM to [facility]. (Target for all goals 2 months 03/08/20)    Status  On-going      PT LONG TERM GOAL #2   Title  Pt will improve Lt ankle strength to 5/5 to improve function    Status  On-going      PT LONG TERM GOAL #3   Title  Pt will be able to ambulate  community distances [facility] gait pattern.    Status  New      PT LONG TERM GOAL #4   Title  Pt will be able to perform at least 30 minutes of standing ADL's with overall less than 3/10 pain    Status  On-going            Plan - 02/18/20  1556    Clinical Impression Statement  Antalgic gait still present, partially due to DF restriction as well as balance control.  Difficulty c inversion control in strengthening intervention.  Continued skilled PT services indicated to continue to improve impairments.    Examination-Activity Limitations  Carry;Stairs;Squat;Transfers;Stand;Lift    Examination-Participation Restrictions  Cleaning;Community Activity;Driving;Shop;Laundry    Stability/Clinical Decision Making  Evolving/Moderate complexity    Rehab Potential  Good    PT Frequency  2x / week    PT Duration  8 weeks    PT Treatment/Interventions  ADLs/Self Care Home Management;Aquatic Therapy;Cryotherapy;Electrical Stimulation;Iontophoresis 4mg /ml Dexamethasone;Moist Heat;Ultrasound;Gait training;Stair training;Therapeutic activities;Therapeutic exercise;Balance training;Neuromuscular re-education;Patient/family education;Manual techniques;Passive range of motion;Dry needling;Taping;Joint Manipulations    PT Next Visit Plan  Continue to improve ROM, strength and SL balance control.    PT Home Exercise Plan  Access Code: [email or web address]    Consulted and Agree with Plan of Care  Patient       Patient will benefit from skilled therapeutic intervention in order to improve the following deficits and impairments:  Abnormal gait, Decreased activity tolerance, Difficulty walking, Decreased strength, Hypomobility, Impaired flexibility, Pain  Visit Diagnosis: Pain in left ankle and joints of left foot  Stiffness of left ankle, not elsewhere classified  Other abnormalities of gait and mobility     Problem List Patient Active Problem List   Diagnosis Date Noted  . Bimalleolar ankle fracture, left, closed, initial encounter   . Allergic reaction 06/16/2019  . Hyperglycemia 06/16/2019  . Postmenopausal symptoms 01/20/2019  . Viral URI with cough 01/20/2019  . Patellofemoral syndrome of both knees 09/09/2017  . Osteochondroma 05/31/2015  .  Allergic rhinitis 03/24/2015  . Obesity 02/25/2015  . Routine general medical examination at a health care facility 02/25/2015   Scot Jun, PT, DPT, OCS, ATC 02/18/20  4:11 PM    [facility] [address] [place], [place], [address] Phone: [phone]   Fax:  [phone]  Name: Rachel Graham MRN: [id number] Date of Birth: November 17, 1971

## 2020-03-03 ENCOUNTER — Telehealth: Payer: Self-pay | Admitting: Orthopedic Surgery

## 2020-03-03 NOTE — Telephone Encounter (Signed)
Patient called.   She is not asking to be taken out of work but she is requesting a note for her job detailing her condition and restrictions. Her ankle swells and hurts every now and then so should would like a note that covers her in those situations.  Call back: [phone]

## 2020-03-04 ENCOUNTER — Other Ambulatory Visit: Payer: Self-pay

## 2020-03-04 ENCOUNTER — Encounter: Payer: 59 | Admitting: Physical Therapy

## 2020-03-04 NOTE — Telephone Encounter (Signed)
I called and sw pt to advise that letter is at the front desk for pick up.

## 2020-03-04 NOTE — Telephone Encounter (Signed)
Can you please see message below and let me know what I should advise pt or what letter we can write for her. I do not see restrictions in her office notes.

## 2020-03-04 NOTE — Telephone Encounter (Signed)
I think the best restriction based on that would be ability to sit and elevate leg as needed for swelling. Happy to add anything else that she thinks she needs

## 2020-03-09 ENCOUNTER — Ambulatory Visit: Payer: 59 | Admitting: Physical Therapy

## 2020-03-09 ENCOUNTER — Encounter: Payer: Self-pay | Admitting: Physical Therapy

## 2020-03-09 ENCOUNTER — Other Ambulatory Visit: Payer: Self-pay

## 2020-03-09 DIAGNOSIS — R2689 Other abnormalities of gait and mobility: Secondary | ICD-10-CM | POA: Diagnosis not present

## 2020-03-09 DIAGNOSIS — M25672 Stiffness of left ankle, not elsewhere classified: Secondary | ICD-10-CM | POA: Diagnosis not present

## 2020-03-09 DIAGNOSIS — M25572 Pain in left ankle and joints of left foot: Secondary | ICD-10-CM | POA: Diagnosis not present

## 2020-03-09 NOTE — Therapy (Signed)
[facility] Therapy [address] [place], [place], [address] Phone: [phone]   Fax:  [phone]  Physical Therapy Treatment Re-cert   Patient Details  Name: Rachel Graham MRN: [id number] Date of Birth: 06/24/1971 Referring Provider (PT): Persons, Bevely Palmer, [place]   Encounter Date: 03/09/2020  PT End of Session - 03/09/20 1614    Visit Number  9    Number of Visits  18    Date for PT Re-Evaluation  05/06/20    Authorization Type  [facility]    PT Start Time  1530    PT Stop Time  1610    PT Time Calculation (min)  40 min    Activity Tolerance  Patient tolerated treatment well    Behavior During Therapy  [facility] for tasks assessed/performed       Past Medical History:  Diagnosis Date  . Blood in stool   . Contact lens/glasses fitting    wears glases or contacts    Past Surgical History:  Procedure Laterality Date  . DIAGNOSTIC LAPAROSCOPY  2010   lt ovarian cyst-LOA  . fibroids surgery  2008   d/c hysteroscopy  . GANGLION CYST EXCISION Right 04/27/2014   Procedure: RIGHT WRIST EXCISION OF MASS ;  Surgeon: Tennis Must, MD;  Location: [place];  Service: Orthopedics;  Laterality: Right;  . INCISION AND DRAINAGE ABSCESS     buttocks- "boil"  . ORIF ANKLE FRACTURE Left 12/04/2019   Procedure: OPEN REDUCTION INTERNAL FIXATION (ORIF) LEFT BIMALLEOLAR ANKLE FRACTURE;  Surgeon: Newt Minion, MD;  Location: [place];  Service: Orthopedics;  Laterality: Left;    There were no vitals filed for this visit.  Subjective Assessment - 03/09/20 1540    Subjective  Pt reporting 4/10 pain today in ankle.    Pertinent History  S/P ORIF 12/04/19, WBAT and wean out of boot.    Limitations  Lifting;Standing;Walking;House hold activities    How long can you stand comfortably?  5-10 min    Diagnostic tests  most recent XR 01/05/20 "demonstrate well-maintained alignment through the mortise of the ankle.  Hardware is intact and in place there does  appear to be some interval healing"    Patient Stated Goals  get back to normal with walking and ADL's    Currently in Pain?  Yes    Pain Score  4     Pain Location  Ankle    Pain Orientation  Left    Pain Descriptors / Indicators  Aching    Pain Type  Surgical pain    Pain Onset  More than a month ago                       [facility] Adult PT Treatment/Exercise - 03/09/20 0001      Manual Therapy   Manual therapy comments  Lt ankle g3-g4 ap mobs, medial/lateral sub talar mobs.      Ankle Exercises: Standing   BAPS  Standing;Level 2;15 reps;Limitations   UE support single hand   Other Standing Ankle Exercises  Balance board rocking side to side, front and back, circles clock wise and counter clockwise with UE support, foam beam with hand held assistance x 6 reps, Airex (feet apart, feet together, modified tandum stance bilateral directions)    Other Standing Ankle Exercises  lateral step ups x 15 reps, cone slides 3 directions with bilateral LE's.       Ankle Exercises: Stretches   Slant  Board Stretch  3 reps;30 seconds      Ankle Exercises: Aerobic   Recumbent Bike  L2 5 minutes      Ankle Exercises: Supine   T-Band  Blue band inversion, eversion 20 x each               PT Short Term Goals - 03/09/20 1616      PT SHORT TERM GOAL #1   Title  Pt will be I and compliant with HEP.    Time  4    Period  Weeks    Status  On-going    Target Date  02/09/20        PT Long Term Goals - 03/09/20 1616      PT LONG TERM GOAL #1   Title  Pt will improve Lt ankle ROM to [facility]. (Target for all goals 2 months 03/08/20)    Status  On-going      PT LONG TERM GOAL #2   Title  Pt will improve Lt ankle strength to 5/5 to improve function    Status  On-going      PT LONG TERM GOAL #3   Title  Pt will be able to ambulate  community distances [facility] gait pattern.    Status  On-going      PT LONG TERM GOAL #4   Title  Pt will be able to perform at least 30 minutes of  standing ADL's with overall less than 3/10 pain    Status  On-going            Plan - 03/09/20 1618    Clinical Impression Statement  Pt arriving today for her 9th therapy visit out of 18. Pt reporting 4/10 L ankle pain. Pt still lacking overall strength and balance. Pt amb with antalgic gait pattern with decreased weight bearing on L LE and decreaesd stance phase. Pt tolerating more standing dynamic balance exercises today during session with no reports of increasd pain. Pt is still progressing toward all goals set. Requesting date extension to 05/06/2020.    Examination-Activity Limitations  Carry;Stairs;Squat;Transfers;Stand;Lift    Examination-Participation Restrictions  Cleaning;Community Activity;Driving;Shop;Laundry    Stability/Clinical Decision Making  Evolving/Moderate complexity    Rehab Potential  Good    PT Frequency  2x / week    PT Duration  8 weeks    PT Treatment/Interventions  ADLs/Self Care Home Management;Aquatic Therapy;Cryotherapy;Electrical Stimulation;Iontophoresis 4mg /ml Dexamethasone;Moist Heat;Ultrasound;Gait training;Stair training;Therapeutic activities;Therapeutic exercise;Balance training;Neuromuscular re-education;Patient/family education;Manual techniques;Passive range of motion;Dry needling;Taping;Joint Manipulations    PT Next Visit Plan  Continue to improve ROM, strength and SL balance control.    PT Home Exercise Plan  Access Code: [email or web address]    Consulted and Agree with Plan of Care  Patient       Patient will benefit from skilled therapeutic intervention in order to improve the following deficits and impairments:  Abnormal gait, Decreased activity tolerance, Difficulty walking, Decreased strength, Hypomobility, Impaired flexibility, Pain  Visit Diagnosis: Pain in left ankle and joints of left foot - Plan: PT plan of care cert/re-cert  Stiffness of left ankle, not elsewhere classified - Plan: PT plan of care cert/re-cert  Other abnormalities of gait  and mobility - Plan: PT plan of care cert/re-cert     Problem List Patient Active Problem List   Diagnosis Date Noted  . Bimalleolar ankle fracture, left, closed, initial encounter   . Allergic reaction 06/16/2019  . Hyperglycemia 06/16/2019  . Postmenopausal symptoms 01/20/2019  . Viral URI with cough 01/20/2019  .  Patellofemoral syndrome of both knees 09/09/2017  . Osteochondroma 05/31/2015  . Allergic rhinitis 03/24/2015  . Obesity 02/25/2015  . Routine general medical examination at a health care facility 02/25/2015    Oretha Caprice, MPT 03/09/2020, 4:34 PM  [facility] Physical Therapy [address] [place], [place], [address] Phone: [phone]   Fax:  [phone]  Name: Rachel Graham MRN: [id number] Date of Birth: [birth year]

## 2020-03-11 ENCOUNTER — Encounter: Payer: 59 | Admitting: Physical Therapy

## 2020-03-11 ENCOUNTER — Telehealth: Payer: Self-pay | Admitting: Physical Therapy

## 2020-03-11 NOTE — Telephone Encounter (Signed)
Spoke with pt regarding missed appointment today. She stated didn't know she had an appointment scheduled today. She states she plans to come to the next appointment on Monday.   Kristoffer Leamon PT, DPT, LAT, ATC  03/11/20  4:02 PM

## 2020-03-14 ENCOUNTER — Ambulatory Visit: Payer: 59 | Admitting: Physical Therapy

## 2020-03-14 ENCOUNTER — Encounter: Payer: Self-pay | Admitting: Physical Therapy

## 2020-03-14 ENCOUNTER — Other Ambulatory Visit: Payer: Self-pay

## 2020-03-14 DIAGNOSIS — M25572 Pain in left ankle and joints of left foot: Secondary | ICD-10-CM | POA: Diagnosis not present

## 2020-03-14 DIAGNOSIS — R2689 Other abnormalities of gait and mobility: Secondary | ICD-10-CM | POA: Diagnosis not present

## 2020-03-14 DIAGNOSIS — M25672 Stiffness of left ankle, not elsewhere classified: Secondary | ICD-10-CM

## 2020-03-14 NOTE — Therapy (Signed)
[facility] Physical Therapy [address] [place], [place], [address] Phone: [phone]   Fax:  [phone]  Physical Therapy Treatment  Patient Details  Name: Rachel Graham MRN: [id number] Date of Birth: [birth year] Referring Provider (PT): Persons, Bevely Palmer, [place]   Encounter Date: 03/14/2020  PT End of Session - 03/14/20 1544    Visit Number  10    Number of Visits  18    Date for PT Re-Evaluation  05/06/20    Authorization Type  [facility]    PT Start Time  1536    PT Stop Time  [id number]    PT Time Calculation (min)  41 min    Activity Tolerance  Patient tolerated treatment well    Behavior During Therapy  [facility] for tasks assessed/performed       Past Medical History:  Diagnosis Date  . Blood in stool   . Contact lens/glasses fitting    wears glases or contacts    Past Surgical History:  Procedure Laterality Date  . DIAGNOSTIC LAPAROSCOPY  2010   lt ovarian cyst-LOA  . fibroids surgery  2008   d/c hysteroscopy  . GANGLION CYST EXCISION Right 04/27/2014   Procedure: RIGHT WRIST EXCISION OF MASS ;  Surgeon: Tennis Must, MD;  Location: [place];  Service: Orthopedics;  Laterality: Right;  . INCISION AND DRAINAGE ABSCESS     buttocks- "boil"  . ORIF ANKLE FRACTURE Left 12/04/2019   Procedure: OPEN REDUCTION INTERNAL FIXATION (ORIF) LEFT BIMALLEOLAR ANKLE FRACTURE;  Surgeon: Newt Minion, MD;  Location: [place];  Service: Orthopedics;  Laterality: Left;    There were no vitals filed for this visit.  Subjective Assessment - 03/14/20 1543    Subjective  Pt arriving to therapy today reporting no pain today.    Pertinent History  S/P ORIF 12/04/19, WBAT and wean out of boot.    Limitations  Lifting;Standing;Walking;House hold activities    How long can you stand comfortably?  5-10 min    Diagnostic tests  most recent XR 01/05/20 "demonstrate well-maintained alignment through the mortise of the ankle.  Hardware is intact and in place there  does appear to be some interval healing"    Patient Stated Goals  get back to normal with walking and ADL's    Currently in Pain?  No/denies                       [facility] Adult PT Treatment/Exercise - 03/14/20 0001      Ankle Exercises: Stretches   Slant Board Stretch  3 reps;30 seconds      Ankle Exercises: Aerobic   Recumbent Bike  L2 5 minutes      Ankle Exercises: Standing   Vector Stance  5 reps    SLS  cone taps, soccer ball stops, kicking ball back and forth, braiding alternating front and back.     Other Standing Ankle Exercises  step downs 4 inch step x 15 single UE support, lateral step ups x 15 reps, step taps with L foot on 4 inch step with heel and with toe x 15,     Other Standing Ankle Exercises  TRX deep squats x 10, press ups x 10 reps, ankle df stretch while performing chest press with TRX. x 10 reps               PT Short Term Goals - 03/09/20 1616      PT SHORT TERM GOAL #  1   Title  Pt will be I and compliant with HEP.    Time  4    Period  Weeks    Status  On-going    Target Date  02/09/20        PT Long Term Goals - 03/09/20 1616      PT LONG TERM GOAL #1   Title  Pt will improve Lt ankle ROM to [facility]. (Target for all goals 2 months 03/08/20)    Status  On-going      PT LONG TERM GOAL #2   Title  Pt will improve Lt ankle strength to 5/5 to improve function    Status  On-going      PT LONG TERM GOAL #3   Title  Pt will be able to ambulate  community distances [facility] gait pattern.    Status  On-going      PT LONG TERM GOAL #4   Title  Pt will be able to perform at least 30 minutes of standing ADL's with overall less than 3/10 pain    Status  On-going            Plan - 03/14/20 1548    Clinical Impression Statement  Pt arriving today 6 minutes late for appointment. Pt reporting no pain today only stiffness. Pt progressing with strength and gait functionality. Pt tolreating more standing dynamic balance and single leg  stance. Pt still with limited motion stepping down off steps with L LE controlling descent. Continue skilled PT.    Examination-Activity Limitations  Carry;Stairs;Squat;Transfers;Stand;Lift    Examination-Participation Restrictions  Cleaning;Community Activity;Driving;Shop;Laundry    Stability/Clinical Decision Making  Evolving/Moderate complexity    Rehab Potential  Good    PT Frequency  2x / week    PT Treatment/Interventions  ADLs/Self Care Home Management;Aquatic Therapy;Cryotherapy;Electrical Stimulation;Iontophoresis 4mg /ml Dexamethasone;Moist Heat;Ultrasound;Gait training;Stair training;Therapeutic activities;Therapeutic exercise;Balance training;Neuromuscular re-education;Patient/family education;Manual techniques;Passive range of motion;Dry needling;Taping;Joint Manipulations    PT Next Visit Plan  Continue to improve ROM, strength and SL balance control.    PT Home Exercise Plan  Access Code: [email or web address]    Consulted and Agree with Plan of Care  Patient       Patient will benefit from skilled therapeutic intervention in order to improve the following deficits and impairments:  Abnormal gait, Decreased activity tolerance, Difficulty walking, Decreased strength, Hypomobility, Impaired flexibility, Pain  Visit Diagnosis: Pain in left ankle and joints of left foot  Stiffness of left ankle, not elsewhere classified  Other abnormalities of gait and mobility     Problem List Patient Active Problem List   Diagnosis Date Noted  . Bimalleolar ankle fracture, left, closed, initial encounter   . Allergic reaction 06/16/2019  . Hyperglycemia 06/16/2019  . Postmenopausal symptoms 01/20/2019  . Viral URI with cough 01/20/2019  . Patellofemoral syndrome of both knees 09/09/2017  . Osteochondroma 05/31/2015  . Allergic rhinitis 03/24/2015  . Obesity 02/25/2015  . Routine general medical examination at a health care facility 02/25/2015    Oretha Caprice  MPT [facility]  Physical Therapy [address] [place], [place], [address] Phone: [phone]   Fax:  [phone]  Name: Rachel Graham MRN: [id number] Date of Birth: 1971-11-05

## 2020-03-16 ENCOUNTER — Other Ambulatory Visit: Payer: Self-pay

## 2020-03-16 ENCOUNTER — Ambulatory Visit (INDEPENDENT_AMBULATORY_CARE_PROVIDER_SITE_OTHER): Payer: 59 | Admitting: Physical Therapy

## 2020-03-16 ENCOUNTER — Encounter: Payer: Self-pay | Admitting: Physical Therapy

## 2020-03-16 DIAGNOSIS — R2689 Other abnormalities of gait and mobility: Secondary | ICD-10-CM

## 2020-03-16 DIAGNOSIS — M25572 Pain in left ankle and joints of left foot: Secondary | ICD-10-CM

## 2020-03-16 DIAGNOSIS — M25672 Stiffness of left ankle, not elsewhere classified: Secondary | ICD-10-CM | POA: Diagnosis not present

## 2020-03-16 NOTE — Therapy (Signed)
[facility] Physical Therapy [address] [place], [place], [address] Phone: [phone]   Fax:  [phone]  Physical Therapy Treatment  Patient Details  Name: Rachel Graham MRN: [id number] Date of Birth: 1971-04-17 Referring Provider (PT): Persons, Bevely Palmer, [place]   Encounter Date: 03/16/2020  PT End of Session - 03/16/20 1536    Visit Number  11    Number of Visits  18    Date for PT Re-Evaluation  05/06/20    Authorization Type  [facility]    PT Start Time  1532    PT Stop Time  1615    PT Time Calculation (min)  43 min    Activity Tolerance  Patient tolerated treatment well    Behavior During Therapy  [facility] for tasks assessed/performed       Past Medical History:  Diagnosis Date  . Blood in stool   . Contact lens/glasses fitting    wears glases or contacts    Past Surgical History:  Procedure Laterality Date  . DIAGNOSTIC LAPAROSCOPY  2010   lt ovarian cyst-LOA  . fibroids surgery  2008   d/c hysteroscopy  . GANGLION CYST EXCISION Right 04/27/2014   Procedure: RIGHT WRIST EXCISION OF MASS ;  Surgeon: Tennis Must, MD;  Location: [place];  Service: Orthopedics;  Laterality: Right;  . INCISION AND DRAINAGE ABSCESS     buttocks- "boil"  . ORIF ANKLE FRACTURE Left 12/04/2019   Procedure: OPEN REDUCTION INTERNAL FIXATION (ORIF) LEFT BIMALLEOLAR ANKLE FRACTURE;  Surgeon: Newt Minion, MD;  Location: [place];  Service: Orthopedics;  Laterality: Left;    There were no vitals filed for this visit.  Subjective Assessment - 03/16/20 1535    Subjective  Pt arriving to therapy reporting 3/10 pain today. Pt reports she has been working on heel to toe gait pattern.    Pertinent History  S/P ORIF 12/04/19, WBAT and wean out of boot.    Limitations  Lifting;Standing;Walking;House hold activities    How long can you stand comfortably?  5-10 min    Diagnostic tests  most recent XR 01/05/20 "demonstrate well-maintained alignment through the  mortise of the ankle.  Hardware is intact and in place there does appear to be some interval healing"    Patient Stated Goals  get back to normal with walking and ADL's    Currently in Pain?  Yes    Pain Score  3     Pain Location  Ankle    Pain Orientation  Left    Pain Descriptors / Indicators  Aching    Pain Type  Surgical pain    Pain Onset  More than a month ago                       [facility] Adult PT Treatment/Exercise - 03/16/20 0001      Manual Therapy   Manual Therapy  Passive ROM    Passive ROM  grade 2-3 sub talar mobs, all planes      Ankle Exercises: Stretches   Slant Board Stretch  3 reps;30 seconds      Ankle Exercises: Aerobic   Recumbent Bike  L3 6 minutes      Ankle Exercises: Standing   Vector Stance  5 reps    Other Standing Ankle Exercises  step downs 4 inch step x 15 single UE support, lateral step ups x 15 reps, step taps with L foot on 4 inch step with  heel and with toe x 15,     Other Standing Ankle Exercises  TRX deep squats x 10, press ups x 10 reps, ankle df stretch while performing chest press with TRX. x 10 reps               PT Short Term Goals - 03/16/20 1630      PT SHORT TERM GOAL #1   Title  Pt will be I and compliant with HEP.    Time  4    Period  Weeks    Status  On-going    Target Date  02/09/20        PT Long Term Goals - 03/09/20 1616      PT LONG TERM GOAL #1   Title  Pt will improve Lt ankle ROM to [facility]. (Target for all goals 2 months 03/08/20)    Status  On-going      PT LONG TERM GOAL #2   Title  Pt will improve Lt ankle strength to 5/5 to improve function    Status  On-going      PT LONG TERM GOAL #3   Title  Pt will be able to ambulate  community distances [facility] gait pattern.    Status  On-going      PT LONG TERM GOAL #4   Title  Pt will be able to perform at least 30 minutes of standing ADL's with overall less than 3/10 pain    Status  On-going            Plan - 03/16/20 1546     Clinical Impression Statement  Pt arriving today reporting 3/10 pain in her L ankle. Pt reporting she feels it's because she has been walking more and working on heel to toe gait pattern. Pt still having difficutly with step downs using L LE as support. Pt progressing with strength and ROM. Instructed pt on calf stretching using a towel at home. Continue skilled PT.    Examination-Activity Limitations  Carry;Stairs;Squat;Transfers;Stand;Lift    Examination-Participation Restrictions  Cleaning;Community Activity;Driving;Shop;Laundry    Stability/Clinical Decision Making  Evolving/Moderate complexity    Rehab Potential  Good    PT Frequency  2x / week    PT Duration  8 weeks    PT Treatment/Interventions  ADLs/Self Care Home Management;Aquatic Therapy;Cryotherapy;Electrical Stimulation;Iontophoresis 4mg /ml Dexamethasone;Moist Heat;Ultrasound;Gait training;Stair training;Therapeutic activities;Therapeutic exercise;Balance training;Neuromuscular re-education;Patient/family education;Manual techniques;Passive range of motion;Dry needling;Taping;Joint Manipulations    PT Next Visit Plan  Continue to improve ROM, strength and SL balance control.    PT Home Exercise Plan  Access Code: [email or web address]    Consulted and Agree with Plan of Care  Patient       Patient will benefit from skilled therapeutic intervention in order to improve the following deficits and impairments:  Abnormal gait, Decreased activity tolerance, Difficulty walking, Decreased strength, Hypomobility, Impaired flexibility, Pain  Visit Diagnosis: Pain in left ankle and joints of left foot  Stiffness of left ankle, not elsewhere classified  Other abnormalities of gait and mobility     Problem List Patient Active Problem List   Diagnosis Date Noted  . Bimalleolar ankle fracture, left, closed, initial encounter   . Allergic reaction 06/16/2019  . Hyperglycemia 06/16/2019  . Postmenopausal symptoms 01/20/2019  . Viral URI with cough  01/20/2019  . Patellofemoral syndrome of both knees 09/09/2017  . Osteochondroma 05/31/2015  . Allergic rhinitis 03/24/2015  . Obesity 02/25/2015  . Routine general medical examination at a health care facility 02/25/2015  Oretha Caprice, MPT 03/16/2020, 4:34 PM  [facility] Physical Therapy [address] [place], [place], [address] Phone: [phone]   Fax:  [phone]  Name: Rachel Graham MRN: [id number] Date of Birth: [birth year]

## 2020-03-21 ENCOUNTER — Ambulatory Visit (INDEPENDENT_AMBULATORY_CARE_PROVIDER_SITE_OTHER): Payer: 59 | Admitting: Physical Therapy

## 2020-03-21 ENCOUNTER — Other Ambulatory Visit: Payer: Self-pay

## 2020-03-21 DIAGNOSIS — M25572 Pain in left ankle and joints of left foot: Secondary | ICD-10-CM

## 2020-03-21 DIAGNOSIS — R2689 Other abnormalities of gait and mobility: Secondary | ICD-10-CM | POA: Diagnosis not present

## 2020-03-21 DIAGNOSIS — M25672 Stiffness of left ankle, not elsewhere classified: Secondary | ICD-10-CM | POA: Diagnosis not present

## 2020-03-21 NOTE — Therapy (Signed)
[facility] Physical Therapy [address] [place], [place], [address] Phone: [phone]   Fax:  [phone]  Physical Therapy Treatment  Patient Details  Name: Rachel Graham MRN: [id number] Date of Birth: 1971/03/11 Referring Provider (PT): Persons, Bevely [place], [place]   Encounter Date: 03/21/2020  PT End of Session - 03/21/20 1637    Visit Number  12    Number of Visits  18    Date for PT Re-Evaluation  05/06/20    Authorization Type  [facility]    PT Start Time  1532    PT Stop Time  1610    PT Time Calculation (min)  38 min    Activity Tolerance  Patient tolerated treatment well    Behavior During Therapy  [facility] for tasks assessed/performed       Past Medical History:  Diagnosis Date  . Blood in stool   . Contact lens/glasses fitting    wears glases or contacts    Past Surgical History:  Procedure Laterality Date  . DIAGNOSTIC LAPAROSCOPY  2010   lt ovarian cyst-LOA  . fibroids surgery  2008   d/c hysteroscopy  . GANGLION CYST EXCISION Right 04/27/2014   Procedure: RIGHT WRIST EXCISION OF MASS ;  Surgeon: Tennis Must, MD;  Location: [place];  Service: Orthopedics;  Laterality: Right;  . INCISION AND DRAINAGE ABSCESS     buttocks- "boil"  . ORIF ANKLE FRACTURE Left 12/04/2019   Procedure: OPEN REDUCTION INTERNAL FIXATION (ORIF) LEFT BIMALLEOLAR ANKLE FRACTURE;  Surgeon: Newt Minion, MD;  Location: [place];  Service: Orthopedics;  Laterality: Left;    There were no vitals filed for this visit.  Subjective Assessment - 03/21/20 1633    Subjective  no new complaints. Pain levels overall mild. Continues to have stiffness in her ankle    Pertinent History  S/P ORIF 12/04/19, WBAT and wean out of boot.    Limitations  Lifting;Standing;Walking;House hold activities    How long can you stand comfortably?  5-10 min    Diagnostic tests  most recent XR 01/05/20 "demonstrate well-maintained alignment through the mortise of the ankle.   Hardware is intact and in place there does appear to be some interval healing"    Patient Stated Goals  get back to normal with walking and ADL's    Pain Onset  More than a month ago          [facility] Adult PT Treatment/Exercise - 03/21/20 0001      Manual Therapy   Manual therapy comments  Lt ankle PROM all planes and  g3-g4 ap mobs, medial/lateral, sub talar mobs. distraction mobs      Ankle Exercises: Stretches   Set designer  --   30 sec X 3 for gastroc and X 3 for soleus   Other Stretch  DF lunge stretch with Lt foot on slantboard 30 sec X 3      Ankle Exercises: Aerobic   Recumbent Bike  L3 6 minutes      Ankle Exercises: Standing   Other Standing Ankle Exercises  step downs then back up 4 inch step x 2X10, deep squats with UE support 2X10, lunges with one UE support X 10 bilat    Other Standing Ankle Exercises  Heel toe raises bilat 2X15, attemped single leg but lacks strength to perform this        PT Short Term Goals - 03/16/20 1630      PT SHORT TERM GOAL #1  Title  Pt will be I and compliant with HEP.    Time  4    Period  Weeks    Status  On-going    Target Date  02/09/20        PT Long Term Goals - 03/09/20 1616      PT LONG TERM GOAL #1   Title  Pt will improve Lt ankle ROM to [facility]. (Target for all goals 2 months 03/08/20)    Status  On-going      PT LONG TERM GOAL #2   Title  Pt will improve Lt ankle strength to 5/5 to improve function    Status  On-going      PT LONG TERM GOAL #3   Title  Pt will be able to ambulate  community distances [facility] gait pattern.    Status  On-going      PT LONG TERM GOAL #4   Title  Pt will be able to perform at least 30 minutes of standing ADL's with overall less than 3/10 pain    Status  On-going            Plan - 03/21/20 1638    Clinical Impression Statement  Pain levels manageable but continues to lack Lt ankle ROM and strength. Continued to address this as tolerated with exercises and manual therapy.  Continue to progress as tolerated.    Examination-Activity Limitations  Carry;Stairs;Squat;Transfers;Stand;Lift    Examination-Participation Restrictions  Cleaning;Community Activity;Driving;Shop;Laundry    Stability/Clinical Decision Making  Evolving/Moderate complexity    Rehab Potential  Good    PT Frequency  2x / week    PT Duration  8 weeks    PT Treatment/Interventions  ADLs/Self Care Home Management;Aquatic Therapy;Cryotherapy;Electrical Stimulation;Iontophoresis 4mg /ml Dexamethasone;Moist Heat;Ultrasound;Gait training;Stair training;Therapeutic activities;Therapeutic exercise;Balance training;Neuromuscular re-education;Patient/family education;Manual techniques;Passive range of motion;Dry needling;Taping;Joint Manipulations    PT Next Visit Plan  Continue to improve ROM, strength and SL balance control.    PT Home Exercise Plan  Access Code: [email or web address]    Consulted and Agree with Plan of Care  Patient       Patient will benefit from skilled therapeutic intervention in order to improve the following deficits and impairments:  Abnormal gait, Decreased activity tolerance, Difficulty walking, Decreased strength, Hypomobility, Impaired flexibility, Pain  Visit Diagnosis: Pain in left ankle and joints of left foot  Stiffness of left ankle, not elsewhere classified  Other abnormalities of gait and mobility     Problem List Patient Active Problem List   Diagnosis Date Noted  . Bimalleolar ankle fracture, left, closed, initial encounter   . Allergic reaction 06/16/2019  . Hyperglycemia 06/16/2019  . Postmenopausal symptoms 01/20/2019  . Viral URI with cough 01/20/2019  . Patellofemoral syndrome of both knees 09/09/2017  . Osteochondroma 05/31/2015  . Allergic rhinitis 03/24/2015  . Obesity 02/25/2015  . Routine general medical examination at a health care facility 02/25/2015    Silvestre Mesi 03/21/2020, 4:39 PM  [facility] Physical Therapy [address] [place], [place], [address] Phone: [phone]   Fax:  [phone]  Name: ATARAH GRUNDER MRN: [id number] Date of Birth: October 10, 1971

## 2020-03-23 ENCOUNTER — Ambulatory Visit: Payer: 59 | Admitting: Physical Therapy

## 2020-03-23 ENCOUNTER — Other Ambulatory Visit: Payer: Self-pay

## 2020-03-23 ENCOUNTER — Encounter: Payer: Self-pay | Admitting: Physical Therapy

## 2020-03-23 DIAGNOSIS — M25572 Pain in left ankle and joints of left foot: Secondary | ICD-10-CM

## 2020-03-23 DIAGNOSIS — M25672 Stiffness of left ankle, not elsewhere classified: Secondary | ICD-10-CM | POA: Diagnosis not present

## 2020-03-23 DIAGNOSIS — R2689 Other abnormalities of gait and mobility: Secondary | ICD-10-CM | POA: Diagnosis not present

## 2020-03-23 NOTE — Therapy (Signed)
[facility] Physical Therapy [address] [place], [place], [address] Phone: [phone]   Fax:  [phone]  Physical Therapy Treatment  Patient Details  Name: Rachel Graham MRN: [id number] Date of Birth: December 22, 1970 Referring Provider (PT): Persons, Bevely [place], [place]   Encounter Date: 03/23/2020  PT End of Session - 03/23/20 1623    Visit Number  13    Number of Visits  18    Date for PT Re-Evaluation  05/06/20    Authorization Type  [facility]    PT Start Time  1532    PT Stop Time  1612    PT Time Calculation (min)  40 min    Activity Tolerance  Patient tolerated treatment well    Behavior During Therapy  [facility] for tasks assessed/performed       Past Medical History:  Diagnosis Date  . Blood in stool   . Contact lens/glasses fitting    wears glases or contacts    Past Surgical History:  Procedure Laterality Date  . DIAGNOSTIC LAPAROSCOPY  2010   lt ovarian cyst-LOA  . fibroids surgery  2008   d/c hysteroscopy  . GANGLION CYST EXCISION Right 04/27/2014   Procedure: RIGHT WRIST EXCISION OF MASS ;  Surgeon: Tennis Must, MD;  Location: [place];  Service: Orthopedics;  Laterality: Right;  . INCISION AND DRAINAGE ABSCESS     buttocks- "boil"  . ORIF ANKLE FRACTURE Left 12/04/2019   Procedure: OPEN REDUCTION INTERNAL FIXATION (ORIF) LEFT BIMALLEOLAR ANKLE FRACTURE;  Surgeon: Newt Minion, MD;  Location: [place];  Service: Orthopedics;  Laterality: Left;    There were no vitals filed for this visit.  Subjective Assessment - 03/23/20 1622    Subjective  Pain level 6/10 in her Lt ankle today    Pertinent History  S/P ORIF 12/04/19    Limitations  Lifting;Standing;Walking;House hold activities    How long can you stand comfortably?  5-10 min    Diagnostic tests  most recent XR 01/05/20 "demonstrate well-maintained alignment through the mortise of the ankle.  Hardware is intact and in place there does appear to be some interval healing"    Patient Stated Goals  get back to normal with walking and ADL's    Pain Onset  More than a month ago         [facility] PT Assessment - 03/23/20 0001      Assessment   Medical Diagnosis  Pain Lt ankle and Lt foot S/P ORIF 1/8/2    Referring Provider (PT)  Persons, Bevely [place], [place]    Onset Date/Surgical Date  12/04/19      AROM   Left Ankle Dorsiflexion  3    Left Ankle Plantar Flexion  35    Left Ankle Inversion  15    Left Ankle Eversion  20      Strength   Overall Strength Comments  overall Lt ankle strength 4/5 MMT, except for plantarflexion only 3- as she cannot perform full SL heel raise in standing                   [facility] Adult PT Treatment/Exercise - 03/23/20 0001      Ambulation/Gait   Gait Comments  walks without AD, no boot but uses ASO brace and still has decreased ankle DF and push off with gait      Manual Therapy   Manual therapy comments  Lt ankle PROM all planes and  g3-g4 ap mobs, medial/lateral,  sub talar mobs. distraction mobs      Ankle Exercises: Stretches   Other Stretch  DF lunge stretch with Lt foot on slantboard 5 sec X 15      Ankle Exercises: Aerobic   Nustep  8 min L7      Ankle Exercises: Standing   Other Standing Ankle Exercises  step downs then back up 4 inch step x 2X10, then moved up to 6 inch step for lateral step down with Lt leg on step 2X10    Other Standing Ankle Exercises  Heel toe raises bilat 2X15, attemped single leg but lacks strength to perform this.       Ankle Exercises: Machines for Strengthening   Cybex Leg Press  SL leg press Lt leg only 75lbs 3X10               PT Short Term Goals - 03/16/20 1630      PT SHORT TERM GOAL #1   Title  Pt will be I and compliant with HEP.    Time  4    Period  Weeks    Status  On-going    Target Date  02/09/20        PT Long Term Goals - 03/23/20 1625      PT LONG TERM GOAL #1   Title  Pt will improve Lt ankle ROM to [facility]. (Target for all goals 2 months 03/08/20)     Baseline  only has 3-35 deg AROM DF-PF    Status  On-going      PT LONG TERM GOAL #2   Title  Pt will improve Lt ankle strength to 5/5 to improve function    Baseline  3-4/5    Status  On-going      PT LONG TERM GOAL #3   Title  Pt will be able to ambulate  community distances [facility] gait pattern.    Baseline  still lacks DF ROM and push off with gait    Status  On-going      PT LONG TERM GOAL #4   Title  Pt will be able to perform at least 30 minutes of standing ADL's with overall less than 3/10 pain    Status  Achieved            Plan - 03/23/20 1624    Clinical Impression Statement  A little more pain today in her ankle but had no complaints with exercises. Continued to focus on ankle ROM and strength needed for more functional gait. Continue POC    Examination-Activity Limitations  Carry;Stairs;Squat;Transfers;Stand;Lift    Examination-Participation Restrictions  Cleaning;Community Activity;Driving;Shop;Laundry    Stability/Clinical Decision Making  Evolving/Moderate complexity    Rehab Potential  Good    PT Frequency  2x / week    PT Duration  8 weeks    PT Treatment/Interventions  ADLs/Self Care Home Management;Aquatic Therapy;Cryotherapy;Electrical Stimulation;Iontophoresis 4mg /ml Dexamethasone;Moist Heat;Ultrasound;Gait training;Stair training;Therapeutic activities;Therapeutic exercise;Balance training;Neuromuscular re-education;Patient/family education;Manual techniques;Passive range of motion;Dry needling;Taping;Joint Manipulations    PT Next Visit Plan  Continue to improve ROM, strength and SL balance control.    PT Home Exercise Plan  Access Code: [email or web address]    Consulted and Agree with Plan of Care  Patient       Patient will benefit from skilled therapeutic intervention in order to improve the following deficits and impairments:  Abnormal gait, Decreased activity tolerance, Difficulty walking, Decreased strength, Hypomobility, Impaired flexibility, Pain  Visit  Diagnosis: Pain in left ankle and joints of left  foot  Stiffness of left ankle, not elsewhere classified  Other abnormalities of gait and mobility     Problem List Patient Active Problem List   Diagnosis Date Noted  . Bimalleolar ankle fracture, left, closed, initial encounter   . Allergic reaction 06/16/2019  . Hyperglycemia 06/16/2019  . Postmenopausal symptoms 01/20/2019  . Viral URI with cough 01/20/2019  . Patellofemoral syndrome of both knees 09/09/2017  . Osteochondroma 05/31/2015  . Allergic rhinitis 03/24/2015  . Obesity 02/25/2015  . Routine general medical examination at a health care facility 02/25/2015    Silvestre Mesi 03/23/2020, 4:27 PM  [facility] Physical Therapy [address] [place], [place], [address] Phone: [phone]   Fax:  [phone]  Name: ZAYNA KRANER MRN: [id number] Date of Birth: 01-12-1971

## 2020-03-29 ENCOUNTER — Ambulatory Visit (INDEPENDENT_AMBULATORY_CARE_PROVIDER_SITE_OTHER): Payer: 59 | Admitting: Physical Therapy

## 2020-03-29 ENCOUNTER — Other Ambulatory Visit: Payer: Self-pay

## 2020-03-29 ENCOUNTER — Encounter: Payer: Self-pay | Admitting: Physical Therapy

## 2020-03-29 DIAGNOSIS — M25572 Pain in left ankle and joints of left foot: Secondary | ICD-10-CM | POA: Diagnosis not present

## 2020-03-29 DIAGNOSIS — R2689 Other abnormalities of gait and mobility: Secondary | ICD-10-CM

## 2020-03-29 DIAGNOSIS — M25672 Stiffness of left ankle, not elsewhere classified: Secondary | ICD-10-CM

## 2020-03-29 NOTE — Therapy (Signed)
[facility] Physical Therapy [address] [place], [place], [address] Phone: [phone]   Fax:  [phone]  Physical Therapy Treatment  Patient Details  Name: Rachel Graham MRN: [id number] Date of Birth: 1971-06-13 Referring Provider (PT): Persons, Bevely [place], [place]   Encounter Date: 03/29/2020  PT End of Session - 03/29/20 1531    Visit Number  14    Number of Visits  18    Date for PT Re-Evaluation  05/06/20    Authorization Type  [facility]    PT Start Time  [id number]   pt arrived 15 min late   PT Stop Time  1600    PT Time Calculation (min)  30 min    Activity Tolerance  Patient tolerated treatment well    Behavior During Therapy  [facility] for tasks assessed/performed       Past Medical History:  Diagnosis Date  . Blood in stool   . Contact lens/glasses fitting    wears glases or contacts    Past Surgical History:  Procedure Laterality Date  . DIAGNOSTIC LAPAROSCOPY  2010   lt ovarian cyst-LOA  . fibroids surgery  2008   d/c hysteroscopy  . GANGLION CYST EXCISION Right 04/27/2014   Procedure: RIGHT WRIST EXCISION OF MASS ;  Surgeon: Tennis Must, MD;  Location: [place];  Service: Orthopedics;  Laterality: Right;  . INCISION AND DRAINAGE ABSCESS     buttocks- "boil"  . ORIF ANKLE FRACTURE Left 12/04/2019   Procedure: OPEN REDUCTION INTERNAL FIXATION (ORIF) LEFT BIMALLEOLAR ANKLE FRACTURE;  Surgeon: Newt Minion, MD;  Location: [place];  Service: Orthopedics;  Laterality: Left;    There were no vitals filed for this visit.  Subjective Assessment - 03/29/20 1531    Subjective  Lt ankle pain is more today, not sure why, maybe the weather. Pain is 7/10 today    Pertinent History  S/P ORIF 12/04/19    Limitations  Lifting;Standing;Walking;House hold activities    How long can you stand comfortably?  5-10 min    Diagnostic tests  most recent XR 01/05/20 "demonstrate well-maintained alignment through the mortise of the ankle.  Hardware is  intact and in place there does appear to be some interval healing"    Patient Stated Goals  get back to normal with walking and ADL's    Pain Onset  More than a month ago                       [facility] Adult PT Treatment/Exercise - 03/29/20 0001      Manual Therapy   Manual therapy comments  Lt ankle PROM all planes and  g3-g4 ap mobs, medial/lateral, sub talar mobs. distraction mobs      Ankle Exercises: Stretches   Slant Board Stretch  3 reps;30 seconds    Other Stretch  DF lunge stretch with Lt foot on slantboard 5 sec X 10      Ankle Exercises: Aerobic   Nustep  6 min L7      Ankle Exercises: Machines for Strengthening   Cybex Leg Press  SL leg press Lt leg only 75lbs 3X10, then calf raises 50 lbs Lt leg only 2X10, then dropped to 25 lbs X 20 reps Lt leg      Ankle Exercises: Standing   Other Standing Ankle Exercises  eccentric heel raises on Lt with UE support to offload some due to weakness. Then bilat Heel toe raises bilat 2X15  PT Short Term Goals - 03/16/20 1630      PT SHORT TERM GOAL #1   Title  Pt will be I and compliant with HEP.    Time  4    Period  Weeks    Status  On-going    Target Date  02/09/20        PT Long Term Goals - 03/23/20 1625      PT LONG TERM GOAL #1   Title  Pt will improve Lt ankle ROM to [facility]. (Target for all goals 2 months 03/08/20)    Baseline  only has 3-35 deg AROM DF-PF    Status  On-going      PT LONG TERM GOAL #2   Title  Pt will improve Lt ankle strength to 5/5 to improve function    Baseline  3-4/5    Status  On-going      PT LONG TERM GOAL #3   Title  Pt will be able to ambulate  community distances [facility] gait pattern.    Baseline  still lacks DF ROM and push off with gait    Status  On-going      PT LONG TERM GOAL #4   Title  Pt will be able to perform at least 30 minutes of standing ADL's with overall less than 3/10 pain    Status  Achieved            Plan - 03/29/20 1533     Clinical Impression Statement  Progressed PF strength from double leg heel raises to eccentric coming back down with Lt leg only but needs UE support to offload her left leg some due to weakness. Gait overall has improved but continues to lack PF strength push off and DF ROM in stance phase so she does still have slight limp.    Examination-Activity Limitations  Carry;Stairs;Squat;Transfers;Stand;Lift    Examination-Participation Restrictions  Cleaning;Community Activity;Driving;Shop;Laundry    Stability/Clinical Decision Making  Evolving/Moderate complexity    Rehab Potential  Good    PT Frequency  2x / week    PT Duration  8 weeks    PT Treatment/Interventions  ADLs/Self Care Home Management;Aquatic Therapy;Cryotherapy;Electrical Stimulation;Iontophoresis 4mg /ml Dexamethasone;Moist Heat;Ultrasound;Gait training;Stair training;Therapeutic activities;Therapeutic exercise;Balance training;Neuromuscular re-education;Patient/family education;Manual techniques;Passive range of motion;Dry needling;Taping;Joint Manipulations    PT Next Visit Plan  Continue to improve ROM, PF strength and SL balance control.    PT Home Exercise Plan  Access Code: [email or web address]    Consulted and Agree with Plan of Care  Patient       Patient will benefit from skilled therapeutic intervention in order to improve the following deficits and impairments:  Abnormal gait, Decreased activity tolerance, Difficulty walking, Decreased strength, Hypomobility, Impaired flexibility, Pain  Visit Diagnosis: Pain in left ankle and joints of left foot  Stiffness of left ankle, not elsewhere classified  Other abnormalities of gait and mobility     Problem List Patient Active Problem List   Diagnosis Date Noted  . Bimalleolar ankle fracture, left, closed, initial encounter   . Allergic reaction 06/16/2019  . Hyperglycemia 06/16/2019  . Postmenopausal symptoms 01/20/2019  . Viral URI with cough 01/20/2019  . Patellofemoral syndrome  of both knees 09/09/2017  . Osteochondroma 05/31/2015  . Allergic rhinitis 03/24/2015  . Obesity 02/25/2015  . Routine general medical examination at a health care facility 02/25/2015    Silvestre Mesi 03/29/2020, 4:36 PM  [facility] Physical Therapy [address] [place], [place], [address] Phone: [phone]   Fax:  [phone]  Name: TAUHEEDAH PACYNA MRN: [id number] Date of Birth: 11-11-1971

## 2020-03-31 ENCOUNTER — Ambulatory Visit: Payer: 59 | Admitting: Physical Therapy

## 2020-03-31 ENCOUNTER — Other Ambulatory Visit: Payer: Self-pay

## 2020-03-31 DIAGNOSIS — M25572 Pain in left ankle and joints of left foot: Secondary | ICD-10-CM

## 2020-03-31 DIAGNOSIS — R2689 Other abnormalities of gait and mobility: Secondary | ICD-10-CM

## 2020-03-31 DIAGNOSIS — M25672 Stiffness of left ankle, not elsewhere classified: Secondary | ICD-10-CM

## 2020-03-31 NOTE — Therapy (Signed)
[facility] [address] [place], [place], [address] Phone: [phone]   Fax:  [phone]  Physical Therapy Treatment  Patient Details  Name: Rachel Graham MRN: [id number] Date of Birth: 1971-07-19 Referring Provider (PT): Persons, Bevely Palmer, [place]   Encounter Date: 03/31/2020  PT End of Session - 03/31/20 1600    Visit Number  15    Number of Visits  18    Date for PT Re-Evaluation  05/06/20    Authorization Type  [facility] [employer]    PT Start Time  [id number]    PT Stop Time  1601    PT Time Calculation (min)  30 min    Activity Tolerance  Patient tolerated treatment well    Behavior During Therapy  [facility] for tasks assessed/performed       Past Medical History:  Diagnosis Date  . Blood in stool   . Contact lens/glasses fitting    wears glases or contacts    Past Surgical History:  Procedure Laterality Date  . DIAGNOSTIC LAPAROSCOPY  2010   lt ovarian cyst-LOA  . fibroids surgery  2008   d/c hysteroscopy  . GANGLION CYST EXCISION Right 04/27/2014   Procedure: RIGHT WRIST EXCISION OF MASS ;  Surgeon: Tennis Must, MD;  Location: [place];  Service: Orthopedics;  Laterality: Right;  . INCISION AND DRAINAGE ABSCESS     buttocks- "boil"  . ORIF ANKLE FRACTURE Left 12/04/2019   Procedure: OPEN REDUCTION INTERNAL FIXATION (ORIF) LEFT BIMALLEOLAR ANKLE FRACTURE;  Surgeon: Newt Minion, MD;  Location: [place];  Service: Orthopedics;  Laterality: Left;    There were no vitals filed for this visit.  Subjective Assessment - 03/31/20 1549    Subjective  3/10 Lt ankle pain today    Pertinent History  S/P ORIF 12/04/19    Limitations  Lifting;Standing;Walking;House hold activities    How long can you stand comfortably?  5-10 min    Diagnostic tests  most recent XR 01/05/20 "demonstrate well-maintained alignment through the mortise of the ankle.  Hardware is intact and in place there does appear to be some interval healing"    Patient  Stated Goals  get back to normal with walking and ADL's    Pain Onset  More than a month ago                       [facility] Adult PT Treatment/Exercise - 03/31/20 0001      Manual Therapy   Manual therapy comments  Lt ankle PROM all planes and  g3-g4 ap mobs, medial/lateral, sub talar mobs. distraction mobs      Ankle Exercises: Stretches   Slant Board Stretch  3 reps;30 seconds   3 reps gastroc, 3 reps soleus   Other Stretch  DF lunge stretch with Lt foot on slantboard 5 sec X 10      Ankle Exercises: Aerobic   Nustep  6 min L7      Ankle Exercises: Machines for Strengthening   Cybex Leg Press  SL leg press Lt leg only 75lbs 3X10, then calf raises 50 lbs Lt leg only 3X10      Ankle Exercises: Standing   Other Standing Ankle Exercises  step downs then back up 4 inch step lateral and frontal  2X10 ea    Other Standing Ankle Exercises  eccentric heel raises on Lt with UE support to offload some due to weakness 2X10. Then bilat Heel toe raises of  step 2X10               PT Short Term Goals - 03/16/20 1630      PT SHORT TERM GOAL #1   Title  Pt will be I and compliant with HEP.    Time  4    Period  Weeks    Status  On-going    Target Date  02/09/20        PT Long Term Goals - 03/23/20 1625      PT LONG TERM GOAL #1   Title  Pt will improve Lt ankle ROM to [facility]. (Target for all goals 2 months 03/08/20)    Baseline  only has 3-35 deg AROM DF-PF    Status  On-going      PT LONG TERM GOAL #2   Title  Pt will improve Lt ankle strength to 5/5 to improve function    Baseline  3-4/5    Status  On-going      PT LONG TERM GOAL #3   Title  Pt will be able to ambulate  community distances [facility] gait pattern.    Baseline  still lacks DF ROM and push off with gait    Status  On-going      PT LONG TERM GOAL #4   Title  Pt will be able to perform at least 30 minutes of standing ADL's with overall less than 3/10 pain    Status  Achieved            Plan -  03/31/20 1601    Clinical Impression Statement  Continued to focus on ankle ROM and PF strength as tolerated. She is making progress with this but continues to be limited. PT will progress as able.    Examination-Activity Limitations  Carry;Stairs;Squat;Transfers;Stand;Lift    Examination-Participation Restrictions  Cleaning;Community Activity;Driving;Shop;Laundry    Stability/Clinical Decision Making  Evolving/Moderate complexity    Rehab Potential  Good    PT Frequency  2x / week    PT Duration  8 weeks    PT Treatment/Interventions  ADLs/Self Care Home Management;Aquatic Therapy;Cryotherapy;Electrical Stimulation;Iontophoresis 4mg /ml Dexamethasone;Moist Heat;Ultrasound;Gait training;Stair training;Therapeutic activities;Therapeutic exercise;Balance training;Neuromuscular re-education;Patient/family education;Manual techniques;Passive range of motion;Dry needling;Taping;Joint Manipulations    PT Next Visit Plan  Continue to improve ROM, PF strength and SL balance control.    PT Home Exercise Plan  Access Code: [email or web address]    Consulted and Agree with Plan of Care  Patient       Patient will benefit from skilled therapeutic intervention in order to improve the following deficits and impairments:  Abnormal gait, Decreased activity tolerance, Difficulty walking, Decreased strength, Hypomobility, Impaired flexibility, Pain  Visit Diagnosis: Pain in left ankle and joints of left foot  Stiffness of left ankle, not elsewhere classified  Other abnormalities of gait and mobility     Problem List Patient Active Problem List   Diagnosis Date Noted  . Bimalleolar ankle fracture, left, closed, initial encounter   . Allergic reaction 06/16/2019  . Hyperglycemia 06/16/2019  . Postmenopausal symptoms 01/20/2019  . Viral URI with cough 01/20/2019  . Patellofemoral syndrome of both knees 09/09/2017  . Osteochondroma 05/31/2015  . Allergic rhinitis 03/24/2015  . Obesity 02/25/2015  . Routine  general medical examination at a health care facility 02/25/2015    Silvestre Mesi 03/31/2020, 4:40 PM  [facility] [address] [place], [place], [address] Phone: [phone]   Fax:  [phone]  Name: Rachel Graham MRN: [id number] Date of Birth:  08/31/1971   

## 2020-04-05 ENCOUNTER — Other Ambulatory Visit: Payer: Self-pay

## 2020-04-05 ENCOUNTER — Ambulatory Visit (INDEPENDENT_AMBULATORY_CARE_PROVIDER_SITE_OTHER): Payer: 59 | Admitting: Physical Therapy

## 2020-04-05 ENCOUNTER — Encounter: Payer: Self-pay | Admitting: Physical Therapy

## 2020-04-05 DIAGNOSIS — M25672 Stiffness of left ankle, not elsewhere classified: Secondary | ICD-10-CM | POA: Diagnosis not present

## 2020-04-05 DIAGNOSIS — R2689 Other abnormalities of gait and mobility: Secondary | ICD-10-CM | POA: Diagnosis not present

## 2020-04-05 DIAGNOSIS — M25572 Pain in left ankle and joints of left foot: Secondary | ICD-10-CM

## 2020-04-05 NOTE — Therapy (Signed)
[facility] [address] [place], [place], [address] Phone: [phone]   Fax:  [phone]  Physical Therapy Treatment  Patient Details  Name: Rachel Graham MRN: [id number] Date of Birth: Dec 17, 1970 Referring Provider (PT): Persons, Bevely Palmer, [place]   Encounter Date: 04/05/2020  PT End of Session - 04/05/20 1610    Visit Number  16    Number of Visits  18    Date for PT Re-Evaluation  05/06/20    Authorization Type  [facility]    PT Start Time  1530    PT Stop Time  1608    PT Time Calculation (min)  38 min    Activity Tolerance  Patient tolerated treatment well    Behavior During Therapy  [facility] for tasks assessed/performed       Past Medical History:  Diagnosis Date  . Blood in stool   . Contact lens/glasses fitting    wears glases or contacts    Past Surgical History:  Procedure Laterality Date  . DIAGNOSTIC LAPAROSCOPY  2010   lt ovarian cyst-LOA  . fibroids surgery  2008   d/c hysteroscopy  . GANGLION CYST EXCISION Right 04/27/2014   Procedure: RIGHT WRIST EXCISION OF MASS ;  Surgeon: Tennis Must, MD;  Location: [place];  Service: Orthopedics;  Laterality: Right;  . INCISION AND DRAINAGE ABSCESS     buttocks- "boil"  . ORIF ANKLE FRACTURE Left 12/04/2019   Procedure: OPEN REDUCTION INTERNAL FIXATION (ORIF) LEFT BIMALLEOLAR ANKLE FRACTURE;  Surgeon: Newt Minion, MD;  Location: [place];  Service: Orthopedics;  Laterality: Left;    There were no vitals filed for this visit.  Subjective Assessment - 04/05/20 1535    Subjective  no pain upon arrival. Still having difficulty with Lt  plantarflexion strength    Pertinent History  S/P ORIF 12/04/19    Limitations  Lifting;Standing;Walking;House hold activities    How long can you stand comfortably?  5-10 min    Diagnostic tests  most recent XR 01/05/20 "demonstrate well-maintained alignment through the mortise of the ankle.  Hardware is intact and in place there  does appear to be some interval healing"    Patient Stated Goals  get back to normal with walking and ADL's    Pain Onset  More than a month ago         [facility] PT Assessment - 04/05/20 0001      AROM   Left Ankle Dorsiflexion  5    Left Ankle Plantar Flexion  35    Left Ankle Inversion  15    Left Ankle Eversion  20      Strength   Overall Strength Comments  Lt ankle DF 5/5, EV 5/5, INV 4+/5, plantarflexion only 3- as she cannot perform full SL heel raise in standing          [facility] Adult PT Treatment/Exercise - 04/05/20 0001      Manual Therapy   Manual therapy comments  Lt ankle PROM all planes and  g3-g4 ap mobs, medial/lateral, sub talar mobs. distraction mobs      Ankle Exercises: Aerobic   Recumbent Bike  L3 6 minutes      Ankle Exercises: Stretches   Slant Board Stretch  3 reps;30 seconds    Other Stretch  DF lunge stretch with Lt foot on slantboard 10 sec X 10      Ankle Exercises: Machines for Strengthening   Cybex Leg Press  SL leg  press Lt leg only 75lbs 2X10, then calf raises 50 lbs Lt leg only 2X15 seat laid back      Ankle Exercises: Standing   Other Standing Ankle Exercises  step downs then back up 6 inch step lateral 3X10    Other Standing Ankle Exercises  eccentric heel raises on Lt with UE support to offload some due to weakness 2X10. Then bilat Heel toe raises of step 3X10               PT Short Term Goals - 03/16/20 1630      PT SHORT TERM GOAL #1   Title  Pt will be I and compliant with HEP.    Time  4    Period  Weeks    Status  On-going    Target Date  02/09/20        PT Long Term Goals - 03/23/20 1625      PT LONG TERM GOAL #1   Title  Pt will improve Lt ankle ROM to [facility]. (Target for all goals 2 months 03/08/20)    Baseline  only has 3-35 deg AROM DF-PF    Status  On-going      PT LONG TERM GOAL #2   Title  Pt will improve Lt ankle strength to 5/5 to improve function    Baseline  3-4/5    Status  On-going      PT LONG  TERM GOAL #3   Title  Pt will be able to ambulate  community distances [facility] gait pattern.    Baseline  still lacks DF ROM and push off with gait    Status  On-going      PT LONG TERM GOAL #4   Title  Pt will be able to perform at least 30 minutes of standing ADL's with overall less than 3/10 pain    Status  Achieved            Plan - 04/05/20 1611    Clinical Impression Statement  She has improved with strength but still lacks PF and inv strength. She has improved with ROM but still lacks DF and PF ROM. She will continue to benefit from PT to adress these deficits.    Examination-Activity Limitations  Carry;Stairs;Squat;Transfers;Stand;Lift    Examination-Participation Restrictions  Cleaning;Community Activity;Driving;Shop;Laundry    Stability/Clinical Decision Making  Evolving/Moderate complexity    Rehab Potential  Good    PT Frequency  2x / week    PT Duration  8 weeks    PT Treatment/Interventions  ADLs/Self Care Home Management;Aquatic Therapy;Cryotherapy;Electrical Stimulation;Iontophoresis 4mg /ml Dexamethasone;Moist Heat;Ultrasound;Gait training;Stair training;Therapeutic activities;Therapeutic exercise;Balance training;Neuromuscular re-education;Patient/family education;Manual techniques;Passive range of motion;Dry needling;Taping;Joint Manipulations    PT Next Visit Plan  Continue to improve ROM, PF strength and SL balance control.    PT Home Exercise Plan  Access Code: [email or web address]    Consulted and Agree with Plan of Care  Patient       Patient will benefit from skilled therapeutic intervention in order to improve the following deficits and impairments:  Abnormal gait, Decreased activity tolerance, Difficulty walking, Decreased strength, Hypomobility, Impaired flexibility, Pain  Visit Diagnosis: Pain in left ankle and joints of left foot  Stiffness of left ankle, not elsewhere classified  Other abnormalities of gait and mobility     Problem List Patient Active Problem  List   Diagnosis Date Noted  . Bimalleolar ankle fracture, left, closed, initial encounter   . Allergic reaction 06/16/2019  . Hyperglycemia 06/16/2019  . Postmenopausal  symptoms 01/20/2019  . Viral URI with cough 01/20/2019  . Patellofemoral syndrome of both knees 09/09/2017  . Osteochondroma 05/31/2015  . Allergic rhinitis 03/24/2015  . Obesity 02/25/2015  . Routine general medical examination at a health care facility 02/25/2015    Debbe Odea, PT,DPT 04/05/2020, [facility] [address] [place], [place], [address] Phone: [phone]   Fax:  [phone]  Name: Rachel Graham MRN: [id number] Date of Birth: 12-02-1970

## 2020-04-07 ENCOUNTER — Encounter: Payer: Self-pay | Admitting: Physical Therapy

## 2020-04-07 ENCOUNTER — Other Ambulatory Visit: Payer: Self-pay

## 2020-04-07 ENCOUNTER — Ambulatory Visit: Payer: 59 | Admitting: Physical Therapy

## 2020-04-07 DIAGNOSIS — M25572 Pain in left ankle and joints of left foot: Secondary | ICD-10-CM

## 2020-04-07 DIAGNOSIS — R2689 Other abnormalities of gait and mobility: Secondary | ICD-10-CM

## 2020-04-07 DIAGNOSIS — M25672 Stiffness of left ankle, not elsewhere classified: Secondary | ICD-10-CM

## 2020-04-07 NOTE — Therapy (Signed)
[facility] [address] [place], [place], [address] Phone: [phone]   Fax:  [phone]  Physical Therapy Treatment  Patient Details  Name: Rachel Graham MRN: [id number] Date of Birth: [birth year] Referring Provider (PT): Persons, Bevely Palmer, [place]   Encounter Date: 04/07/2020  PT End of Session - 04/07/20 1607    Visit Number  17    Number of Visits  18    Date for PT Re-Evaluation  05/06/20    Authorization Type  [facility]    PT Start Time  1530   pt late for 1515 appt   PT Stop Time  1600    PT Time Calculation (min)  30 min    Activity Tolerance  Patient tolerated treatment well    Behavior During Therapy  [facility] for tasks assessed/performed       Past Medical History:  Diagnosis Date  . Blood in stool   . Contact lens/glasses fitting    wears glases or contacts    Past Surgical History:  Procedure Laterality Date  . DIAGNOSTIC LAPAROSCOPY  2010   lt ovarian cyst-LOA  . fibroids surgery  2008   d/c hysteroscopy  . GANGLION CYST EXCISION Right 04/27/2014   Procedure: RIGHT WRIST EXCISION OF MASS ;  Surgeon: Tennis Must, MD;  Location: [place];  Service: Orthopedics;  Laterality: Right;  . INCISION AND DRAINAGE ABSCESS     buttocks- "boil"  . ORIF ANKLE FRACTURE Left 12/04/2019   Procedure: OPEN REDUCTION INTERNAL FIXATION (ORIF) LEFT BIMALLEOLAR ANKLE FRACTURE;  Surgeon: Newt Minion, MD;  Location: [place];  Service: Orthopedics;  Laterality: Left;    There were no vitals filed for this visit.  Subjective Assessment - 04/07/20 1604    Subjective  no pain, just stiffness, overall getting a little better    Pertinent History  S/P ORIF 12/04/19    Limitations  Lifting;Standing;Walking;House hold activities    How long can you stand comfortably?  5-10 min    Diagnostic tests  most recent XR 01/05/20 "demonstrate well-maintained alignment through the mortise of the ankle.  Hardware is intact and in place there  does appear to be some interval healing"    Patient Stated Goals  get back to normal with walking and ADL's    Pain Onset  More than a month ago        [facility] Adult PT Treatment/Exercise - 04/07/20 0001      High Level Balance   High Level Balance Comments  SLS on Left 20 sec X 3      Manual Therapy   Manual therapy comments  Lt ankle PROM all planes and  g3-g4 ap mobs, medial/lateral, sub talar mobs. distraction mobs      Ankle Exercises: Aerobic   Recumbent Bike  L3 6 minutes      Ankle Exercises: Stretches   Slant Board Stretch  3 reps;30 seconds    Other Stretch  DF lunge stretch with Lt foot on slantboard 10 sec X 10      Ankle Exercises: Machines for Strengthening   Cybex Leg Press  SL calf raises 50 lbs Lt leg only 2X15 seat laid back      Ankle Exercises: Standing   Other Standing Ankle Exercises  step downs then back up 6 inch step lateral 2X10 and fwd 2X10 for Lt     Other Standing Ankle Exercises  eccentric heel raises on Lt with UE support to offload some due to  weakness 2X10. Then bilat Heel toe raises of step 2X10               PT Short Term Goals - 03/16/20 1630      PT SHORT TERM GOAL #1   Title  Pt will be I and compliant with HEP.    Time  4    Period  Weeks    Status  On-going    Target Date  02/09/20        PT Long Term Goals - 03/23/20 1625      PT LONG TERM GOAL #1   Title  Pt will improve Lt ankle ROM to [facility]. (Target for all goals 2 months 03/08/20)    Baseline  only has 3-35 deg AROM DF-PF    Status  On-going      PT LONG TERM GOAL #2   Title  Pt will improve Lt ankle strength to 5/5 to improve function    Baseline  3-4/5    Status  On-going      PT LONG TERM GOAL #3   Title  Pt will be able to ambulate  community distances [facility] gait pattern.    Baseline  still lacks DF ROM and push off with gait    Status  On-going      PT LONG TERM GOAL #4   Title  Pt will be able to perform at least 30 minutes of standing ADL's with overall  less than 3/10 pain    Status  Achieved            Plan - 04/07/20 1609    Clinical Impression Statement  showed some improvement in gait pattern today but still slight limp due to ankle DF ROM and PF strength. She had good tolerance to her strength program. She will need recert next visit.    Examination-Activity Limitations  Carry;Stairs;Squat;Transfers;Stand;Lift    Examination-Participation Restrictions  Cleaning;Community Activity;Driving;Shop;Laundry    Stability/Clinical Decision Making  Evolving/Moderate complexity    Rehab Potential  Good    PT Frequency  2x / week    PT Duration  8 weeks    PT Treatment/Interventions  ADLs/Self Care Home Management;Aquatic Therapy;Cryotherapy;Electrical Stimulation;Iontophoresis 4mg /ml Dexamethasone;Moist Heat;Ultrasound;Gait training;Stair training;Therapeutic activities;Therapeutic exercise;Balance training;Neuromuscular re-education;Patient/family education;Manual techniques;Passive range of motion;Dry needling;Taping;Joint Manipulations    PT Next Visit Plan  Continue to improve ROM, PF strength and SL balance control.    PT Home Exercise Plan  Access Code: [email or web address]    Consulted and Agree with Plan of Care  Patient       Patient will benefit from skilled therapeutic intervention in order to improve the following deficits and impairments:  Abnormal gait, Decreased activity tolerance, Difficulty walking, Decreased strength, Hypomobility, Impaired flexibility, Pain  Visit Diagnosis: Pain in left ankle and joints of left foot  Stiffness of left ankle, not elsewhere classified  Other abnormalities of gait and mobility     Problem List Patient Active Problem List   Diagnosis Date Noted  . Bimalleolar ankle fracture, left, closed, initial encounter   . Allergic reaction 06/16/2019  . Hyperglycemia 06/16/2019  . Postmenopausal symptoms 01/20/2019  . Viral URI with cough 01/20/2019  . Patellofemoral syndrome of both knees 09/09/2017   . Osteochondroma 05/31/2015  . Allergic rhinitis 03/24/2015  . Obesity 02/25/2015  . Routine general medical examination at a health care facility 02/25/2015    Silvestre Mesi 04/07/2020, 4:11 PM  [facility] [address] [place], [place], [address] Phone: [phone]  Fax:  [phone]  Name: Rachel Graham MRN: [id number] Date of Birth: 01-Aug-1971

## 2020-04-12 ENCOUNTER — Other Ambulatory Visit: Payer: Self-pay

## 2020-04-12 ENCOUNTER — Encounter: Payer: Self-pay | Admitting: Rehabilitative and Restorative Service Providers"

## 2020-04-12 ENCOUNTER — Ambulatory Visit (INDEPENDENT_AMBULATORY_CARE_PROVIDER_SITE_OTHER): Payer: 59 | Admitting: Rehabilitative and Restorative Service Providers"

## 2020-04-12 DIAGNOSIS — M25572 Pain in left ankle and joints of left foot: Secondary | ICD-10-CM | POA: Diagnosis not present

## 2020-04-12 DIAGNOSIS — R2689 Other abnormalities of gait and mobility: Secondary | ICD-10-CM | POA: Diagnosis not present

## 2020-04-12 DIAGNOSIS — M25672 Stiffness of left ankle, not elsewhere classified: Secondary | ICD-10-CM | POA: Diagnosis not present

## 2020-04-12 NOTE — Therapy (Signed)
[facility] Physical Therapy [address] [place], [place], [address] Phone: [phone]   Fax:  [phone]  Physical Therapy Treatment/Re-certification  Patient Details  Name: Rachel Graham MRN: [id number] Date of Birth: [birth year] Referring Provider (PT): Persons, Bevely Palmer, [place]   Encounter Date: 04/12/2020  PT End of Session - 04/12/20 1641    Visit Number  18    Number of Visits  18    Date for PT Re-Evaluation  06/03/20    Authorization Type  [facility]    PT Start Time  1515    PT Stop Time  1415    PT Time Calculation (min)  1380 min    Activity Tolerance  Patient tolerated treatment well    Behavior During Therapy  [facility] for tasks assessed/performed       Past Medical History:  Diagnosis Date  . Blood in stool   . Contact lens/glasses fitting    wears glases or contacts    Past Surgical History:  Procedure Laterality Date  . DIAGNOSTIC LAPAROSCOPY  2010   lt ovarian cyst-LOA  . fibroids surgery  2008   d/c hysteroscopy  . GANGLION CYST EXCISION Right 04/27/2014   Procedure: RIGHT WRIST EXCISION OF MASS ;  Surgeon: Tennis Must, MD;  Location: [place];  Service: Orthopedics;  Laterality: Right;  . INCISION AND DRAINAGE ABSCESS     buttocks- "boil"  . ORIF ANKLE FRACTURE Left 12/04/2019   Procedure: OPEN REDUCTION INTERNAL FIXATION (ORIF) LEFT BIMALLEOLAR ANKLE FRACTURE;  Surgeon: Newt Minion, MD;  Location: [place];  Service: Orthopedics;  Laterality: Left;    There were no vitals filed for this visit.  Subjective Assessment - 04/12/20 1636    Subjective  Nicky notes progress with her PT but is frustrated with the pace of recovery.    Pertinent History  S/P ORIF 12/04/19    Limitations  Lifting;Standing;Walking;House hold activities    How long can you stand comfortably?  5-10 min    Diagnostic tests  most recent XR 01/05/20 "demonstrate well-maintained alignment through the mortise of the ankle.  Hardware is intact and  in place there does appear to be some interval healing"    Patient Stated Goals  get back to normal with walking and ADL's    Currently in Pain?  No/denies    Pain Onset  More than a month ago    Pain Frequency  Occasional    Aggravating Factors   Overuse.    Pain Relieving Factors  Movement and rest.    Multiple Pain Sites  No         [facility] PT Assessment - 04/12/20 0001      ROM / Strength   AROM / PROM / Strength  AROM;Strength      AROM   Overall AROM   Deficits    AROM Assessment Site  Ankle    Right/Left Ankle  Left;Right    Right Ankle Dorsiflexion  5   10 soleus   Left Ankle Dorsiflexion  2   2 soleus     Strength   Overall Strength  Deficits    Strength Assessment Site  Ankle    Right/Left Ankle  Left;Right    Right Ankle Dorsiflexion  5/5    Right Ankle Plantar Flexion  5/5    Right Ankle Inversion  5/5    Right Ankle Eversion  5/5    Left Ankle Dorsiflexion  5/5    Left Ankle Plantar Flexion  3/5    Left Ankle Inversion  3+/5    Left Ankle Eversion  5/5                    [facility] Adult PT Treatment/Exercise - 04/12/20 0001      Ambulation/Gait   Ambulation/Gait  Yes    Ambulation/Gait Assistance  7: Independent    Gait Pattern  Decreased step length - left   Due to limited L dorsiflexion     Exercises   Exercises  Ankle      Ankle Exercises: Stretches   Soleus Stretch  5 reps;20 seconds    Soleus Stretch Limitations  Make sure to stay straight from shoulder to hip to knee.  Do not stick butt back..  Slight toe in.    Gastroc Stretch  5 reps;20 seconds    Gastroc Stretch Limitations  Slight toe in.    Slant Board Stretch  3 reps;60 seconds   Slight toe in.   Slant Board Stretch Limitations  Knees straight and bent.  Straight from shoulder to hip to knee.       Ankle Exercises: Standing   Heel Raises  20 reps;3 seconds   2 sets with slow eccentrics.  Shift weight to L side.     Ankle Exercises: Seated   Other Seated Ankle Exercises   Inversion Isometrics 2 sets of 10 5 seconds             PT Education - 04/12/20 1638    Education Details  Thorough review and update to Nicky's HEP post-RA.    Person(s) Educated  Patient    Methods  Explanation;Demonstration;Tactile cues;Verbal cues;Handout    Comprehension  Verbalized understanding;Tactile cues required;Need further instruction;Returned demonstration;Verbal cues required       PT Short Term Goals - 04/12/20 1638      PT SHORT TERM GOAL #1   Title  Pt will be I and compliant with HEP.    Time  4    Period  Weeks    Status  Achieved    Target Date  02/09/20        PT Long Term Goals - 04/12/20 1639      PT LONG TERM GOAL #1   Title  Pt will improve Lt ankle ROM to [facility]. (Target for all goals 2 months 03/08/20)    Baseline  Stiff heel cords.    Time  4    Period  Weeks    Status  On-going      PT LONG TERM GOAL #2   Title  Pt will improve Lt ankle strength to 5/5 to improve function    Baseline  Progress but plantar flexion and inversion need work.    Time  4    Period  Weeks    Status  On-going      PT LONG TERM GOAL #3   Title  Pt will be able to ambulate  community distances [facility] gait pattern.    Baseline  Improved but noticeable DF stiffness with higher speeds.    Time  4    Period  Weeks    Status  On-going      PT LONG TERM GOAL #4   Title  Pt will be able to perform at least 30 minutes of standing ADL's with overall less than 3/10 pain    Status  Achieved            Plan - 04/12/20 1641    Clinical  Impression Statement  [name] is making gains with her ankle strength and gait quality.  Dorsiflexion AROM, plantar flexion and inversion strength are the focus of continued work.  [name] was given the option of continued supervised feedback or more independent rehabilitation.  She would like to attend a few more visits to get comfortable with her new exercises before a month of independent rehabilitation.  Follow-up RA is recommended at  that time to assess progress towards impairments noted today.    Examination-Activity Limitations  Carry;Stairs;Squat;Transfers;Stand;Lift    Examination-Participation Restrictions  Cleaning;Community Activity;Driving;Shop;Laundry    Stability/Clinical Decision Making  Evolving/Moderate complexity    Rehab Potential  Good    PT Frequency  2x / week    PT Duration  8 weeks    PT Treatment/Interventions  ADLs/Self Care Home Management;Aquatic Therapy;Cryotherapy;Electrical Stimulation;Iontophoresis 4mg /ml Dexamethasone;Moist Heat;Ultrasound;Gait training;Stair training;Therapeutic activities;Therapeutic exercise;Balance training;Neuromuscular re-education;Patient/family education;Manual techniques;Passive range of motion;Dry needling;Taping;Joint Manipulations    PT Next Visit Plan  Continue to improve ROM, PF strength and SL balance control.    PT Home Exercise Plan  Access Code: [email or web address]    Consulted and Agree with Plan of Care  Patient       Patient will benefit from skilled therapeutic intervention in order to improve the following deficits and impairments:  Abnormal gait, Decreased activity tolerance, Difficulty walking, Decreased strength, Hypomobility, Impaired flexibility, Pain  Visit Diagnosis: Pain in left ankle and joints of left foot  Stiffness of left ankle, not elsewhere classified  Other abnormalities of gait and mobility     Problem List Patient Active Problem List   Diagnosis Date Noted  . Bimalleolar ankle fracture, left, closed, initial encounter   . Allergic reaction 06/16/2019  . Hyperglycemia 06/16/2019  . Postmenopausal symptoms 01/20/2019  . Viral URI with cough 01/20/2019  . Patellofemoral syndrome of both knees 09/09/2017  . Osteochondroma 05/31/2015  . Allergic rhinitis 03/24/2015  . Obesity 02/25/2015  . Routine general medical examination at a health care facility 02/25/2015    Farley Ly PT, MPT 04/12/2020, 4:44 PM  [facility]  Physical Therapy [address] [place], [place], [address] Phone: [phone]   Fax:  [phone]  Name: DAMESHIA HORIE MRN: [id number] Date of Birth: Mar 05, 1971

## 2020-04-12 NOTE — Patient Instructions (Signed)
Access Code: [email or web address] URL: [email or web address] Date: 04/12/2020 Prepared by: Vista Mink  Exercises Standing Gastroc Stretch - 2-3 x daily - 7 x weekly - 1 sets - 5 reps - 20 hold Standing Soleus Stretch - 1 x daily - 7 x weekly - 1 sets - 5 reps - 20 hold Slant Board Soleus Stretch - 2-3 x daily - 7 x weekly - 1 sets - 3 reps - 60 hold Wedge Standing Stretch - 2-3 x daily - 7 x weekly - 1 sets - 3 reps - 60 hold Standing Heel Raise - 10 x daily - 7 x weekly - 1 sets - 10 reps - 3 seconds hold Isometric Ankle Inversion at Wall - 1 x daily - 7 x weekly - 1 sets - 50 reps - 5 hold

## 2020-04-13 ENCOUNTER — Encounter: Payer: 59 | Admitting: Physical Therapy

## 2020-04-18 ENCOUNTER — Telehealth: Payer: Self-pay | Admitting: Physical Therapy

## 2020-04-18 ENCOUNTER — Encounter: Payer: 59 | Admitting: Physical Therapy

## 2020-04-18 NOTE — Telephone Encounter (Signed)
Pt no show for PT appointment today. They were contacted and informed of this and she states she had her days mixed up but she confirmed she will be at her next appointment on 04/20/20.  Elsie Ra, PT, DPT 04/18/20 3:53 PM

## 2020-04-20 ENCOUNTER — Encounter: Payer: Self-pay | Admitting: Physical Therapy

## 2020-04-20 ENCOUNTER — Other Ambulatory Visit: Payer: Self-pay

## 2020-04-20 ENCOUNTER — Ambulatory Visit (INDEPENDENT_AMBULATORY_CARE_PROVIDER_SITE_OTHER): Payer: 59 | Admitting: Physical Therapy

## 2020-04-20 DIAGNOSIS — M25572 Pain in left ankle and joints of left foot: Secondary | ICD-10-CM

## 2020-04-20 DIAGNOSIS — R2689 Other abnormalities of gait and mobility: Secondary | ICD-10-CM | POA: Diagnosis not present

## 2020-04-20 DIAGNOSIS — M25672 Stiffness of left ankle, not elsewhere classified: Secondary | ICD-10-CM

## 2020-04-20 NOTE — Therapy (Signed)
[facility] Physical Therapy [address] [place], [place], [address] Phone: [phone]   Fax:  [phone]  Physical Therapy Treatment  Patient Details  Name: Rachel Graham MRN: [id number] Date of Birth: 1971/07/05 Referring Provider (PT): Persons, Bevely Palmer, [place]   Encounter Date: 04/20/2020  PT End of Session - 04/20/20 1718    Visit Number  18    Number of Visits  26    Date for PT Re-Evaluation  06/03/20    Authorization Type  [facility]    PT Start Time  1515    PT Stop Time  1600    PT Time Calculation (min)  45 min    Activity Tolerance  Patient tolerated treatment well    Behavior During Therapy  [facility] for tasks assessed/performed       Past Medical History:  Diagnosis Date  . Blood in stool   . Contact lens/glasses fitting    wears glases or contacts    Past Surgical History:  Procedure Laterality Date  . DIAGNOSTIC LAPAROSCOPY  2010   lt ovarian cyst-LOA  . fibroids surgery  2008   d/c hysteroscopy  . GANGLION CYST EXCISION Right 04/27/2014   Procedure: RIGHT WRIST EXCISION OF MASS ;  Surgeon: Tennis Must, MD;  Location: [place];  Service: Orthopedics;  Laterality: Right;  . INCISION AND DRAINAGE ABSCESS     buttocks- "boil"  . ORIF ANKLE FRACTURE Left 12/04/2019   Procedure: OPEN REDUCTION INTERNAL FIXATION (ORIF) LEFT BIMALLEOLAR ANKLE FRACTURE;  Surgeon: Newt Minion, MD;  Location: [place];  Service: Orthopedics;  Laterality: Left;    There were no vitals filed for this visit.  Subjective Assessment - 04/20/20 1624    Subjective  Relays she feels she needs to continue PT to improve her gait, and ROM. Overall about 3/10 Lt ankle pain today with swelling as she has been walking for an hour to 90 minutes at a time now    Pertinent History  S/P ORIF 12/04/19    Limitations  Lifting;Standing;Walking;House hold activities    How long can you stand comfortably?  5-10 min    Diagnostic tests  most recent XR 01/05/20  "demonstrate well-maintained alignment through the mortise of the ankle.  Hardware is intact and in place there does appear to be some interval healing"    Patient Stated Goals  get back to normal with walking and ADL's    Pain Onset  More than a month ago       [facility] Adult PT Treatment/Exercise - 04/20/20 0001      Manual Therapy   Manual therapy comments  Lt ankle PROM all planes and  g3-g4 ap mobs, medial/lateral, sub talar mobs. distraction mobs      Ankle Exercises: Stretches   Gastroc Stretch  3 reps;30 seconds    Gastroc Stretch Limitations  slantboard      Ankle Exercises: Aerobic   Nustep  7 min L7      Ankle Exercises: Machines for Strengthening   Cybex Leg Press  SL calf raises 50 lbs Lt leg only 3X15 seat laid back      Ankle Exercises: Standing   Other Standing Ankle Exercises  step downs then back up 6 inch step lateral 2X10 and fwd 2X10 for Lt . Wobble board for ankle ROM 20 reps A-P, and 20 reps lateral    Other Standing Ankle Exercises  SL heel raises on Lt with UE support to offload some due to  weakness 3X10. Then bilat Heel toe raises of step 3X10               PT Short Term Goals - 04/12/20 1638      PT SHORT TERM GOAL #1   Title  Pt will be I and compliant with HEP.    Time  4    Period  Weeks    Status  Achieved    Target Date  02/09/20        PT Long Term Goals - 04/12/20 1639      PT LONG TERM GOAL #1   Title  Pt will improve Lt ankle ROM to [facility]. (Target for all goals 2 months 03/08/20)    Baseline  Stiff heel cords.    Time  4    Period  Weeks    Status  On-going      PT LONG TERM GOAL #2   Title  Pt will improve Lt ankle strength to 5/5 to improve function    Baseline  Progress but plantar flexion and inversion need work.    Time  4    Period  Weeks    Status  On-going      PT LONG TERM GOAL #3   Title  Pt will be able to ambulate  community distances [facility] gait pattern.    Baseline  Improved but noticeable DF stiffness with  higher speeds.    Time  4    Period  Weeks    Status  On-going      PT LONG TERM GOAL #4   Title  Pt will be able to perform at least 30 minutes of standing ADL's with overall less than 3/10 pain    Status  Achieved            Plan - 04/20/20 1718    Clinical Impression Statement  Gait, ROM, and ankle PF strength slowly improving but continues to have deficits, PT anticpates needing 8 more visits to reach her functional goals.    Examination-Activity Limitations  Carry;Stairs;Squat;Transfers;Stand;Lift    Examination-Participation Restrictions  Cleaning;Community Activity;Driving;Shop;Laundry    Stability/Clinical Decision Making  Evolving/Moderate complexity    Rehab Potential  Good    PT Frequency  2x / week    PT Duration  8 weeks    PT Treatment/Interventions  ADLs/Self Care Home Management;Aquatic Therapy;Cryotherapy;Electrical Stimulation;Iontophoresis 4mg /ml Dexamethasone;Moist Heat;Ultrasound;Gait training;Stair training;Therapeutic activities;Therapeutic exercise;Balance training;Neuromuscular re-education;Patient/family education;Manual techniques;Passive range of motion;Dry needling;Taping;Joint Manipulations    PT Next Visit Plan  Continue to improve ROM, PF strength and SL balance control.    PT Home Exercise Plan  Access Code: [email or web address]    Consulted and Agree with Plan of Care  Patient       Patient will benefit from skilled therapeutic intervention in order to improve the following deficits and impairments:  Abnormal gait, Decreased activity tolerance, Difficulty walking, Decreased strength, Hypomobility, Impaired flexibility, Pain  Visit Diagnosis: Pain in left ankle and joints of left foot  Stiffness of left ankle, not elsewhere classified  Other abnormalities of gait and mobility     Problem List Patient Active Problem List   Diagnosis Date Noted  . Bimalleolar ankle fracture, left, closed, initial encounter   . Allergic reaction 06/16/2019  .  Hyperglycemia 06/16/2019  . Postmenopausal symptoms 01/20/2019  . Viral URI with cough 01/20/2019  . Patellofemoral syndrome of both knees 09/09/2017  . Osteochondroma 05/31/2015  . Allergic rhinitis 03/24/2015  . Obesity 02/25/2015  . Routine general medical examination  at a health care facility 02/25/2015    Debbe Odea, PT,DPT 04/20/2020, 5:20 PM  [facility] Physical Therapy [address] [place], [place], [address] Phone: [phone]   Fax:  [phone]  Name: Rachel Graham MRN: [id number] Date of Birth: 03/28/1971

## 2020-05-04 ENCOUNTER — Ambulatory Visit (INDEPENDENT_AMBULATORY_CARE_PROVIDER_SITE_OTHER): Payer: 59 | Admitting: Physical Therapy

## 2020-05-04 ENCOUNTER — Other Ambulatory Visit: Payer: Self-pay

## 2020-05-04 DIAGNOSIS — M25672 Stiffness of left ankle, not elsewhere classified: Secondary | ICD-10-CM | POA: Diagnosis not present

## 2020-05-04 DIAGNOSIS — M25572 Pain in left ankle and joints of left foot: Secondary | ICD-10-CM | POA: Diagnosis not present

## 2020-05-04 DIAGNOSIS — R2689 Other abnormalities of gait and mobility: Secondary | ICD-10-CM

## 2020-05-05 NOTE — Therapy (Signed)
[facility] Physical Therapy [address] [place], [place], [address] Phone: [phone]   Fax:  [phone]  Physical Therapy Treatment  Patient Details  Name: Rachel Graham MRN: [id number] Date of Birth: 04-Sep-1971 Referring Provider (PT): Persons, Bevely Palmer, [place]   Encounter Date: 05/04/2020   PT End of Session - 05/05/20 0802    Visit Number 19    Number of Visits 26    Date for PT Re-Evaluation 06/03/20    Authorization Type [facility]    PT Start Time 1430    PT Stop Time 1515    PT Time Calculation (min) 45 min    Activity Tolerance Patient tolerated treatment well    Behavior During Therapy [facility] for tasks assessed/performed           Past Medical History:  Diagnosis Date  . Blood in stool   . Contact lens/glasses fitting    wears glases or contacts    Past Surgical History:  Procedure Laterality Date  . DIAGNOSTIC LAPAROSCOPY  2010   lt ovarian cyst-LOA  . fibroids surgery  2008   d/c hysteroscopy  . GANGLION CYST EXCISION Right 04/27/2014   Procedure: RIGHT WRIST EXCISION OF MASS ;  Surgeon: Tennis Must, MD;  Location: [place];  Service: Orthopedics;  Laterality: Right;  . INCISION AND DRAINAGE ABSCESS     buttocks- "boil"  . ORIF ANKLE FRACTURE Left 12/04/2019   Procedure: OPEN REDUCTION INTERNAL FIXATION (ORIF) LEFT BIMALLEOLAR ANKLE FRACTURE;  Surgeon: Newt Minion, MD;  Location: [place];  Service: Orthopedics;  Laterality: Left;    There were no vitals filed for this visit.   Subjective Assessment - 05/04/20 1504    Subjective Relays her ankle is swollen more along with her lower leg, not sure why, denies eating salty foods but has been walking a lot more for 45 minutes at a time often.    Pertinent History S/P ORIF 12/04/19    Limitations Lifting;Standing;Walking;House hold activities    How long can you stand comfortably? 5-10 min    Diagnostic tests most recent XR 01/05/20 "demonstrate well-maintained alignment  through the mortise of the ankle.  Hardware is intact and in place there does appear to be some interval healing"    Patient Stated Goals get back to normal with walking and ADL's    Pain Onset More than a month ago                             [facility] Adult PT Treatment/Exercise - 05/05/20 0001      Ambulation/Gait   Gait Comments still with slight limp due to decreased PF strength      Modalities   Modalities Vasopneumatic      Vasopneumatic   Number Minutes Vasopneumatic  10 minutes    Vasopnuematic Location  Ankle    Vasopneumatic Pressure Medium    Vasopneumatic Temperature  38      Ankle Exercises: Stretches   Gastroc Stretch 3 reps;30 seconds    Gastroc Stretch Limitations slantboard    Other Stretch DF lunge stretch MWM using strap on Lt talus on slantboard 5 sec X 15 reps      Ankle Exercises: Aerobic   Nustep 8 min L7      Ankle Exercises: Standing   Other Standing Ankle Exercises step downs then back up 6 inch step lateral 2X10 and fwd 2X10 for Lt .    Other  Standing Ankle Exercises SL heel raises on Lt with UE support to offload some due to weakness 2X10. Then eccentric heel raise up with both and down with Lt only X 15 then bilat Heel toe raises of step 2X10      Ankle Exercises: Seated   BAPS Level 3;Sitting   20 reps PF/DF   Other Seated Ankle Exercises PF strength with Tband black 2X20 with slow eccentrics                    PT Short Term Goals - 04/12/20 1638      PT SHORT TERM GOAL #1   Title Pt will be I and compliant with HEP.    Time 4    Period Weeks    Status Achieved    Target Date 02/09/20             PT Long Term Goals - 04/12/20 1639      PT LONG TERM GOAL #1   Title Pt will improve Lt ankle ROM to [facility]. (Target for all goals 2 months 03/08/20)    Baseline Stiff heel cords.    Time 4    Period Weeks    Status On-going      PT LONG TERM GOAL #2   Title Pt will improve Lt ankle strength to 5/5 to improve  function    Baseline Progress but plantar flexion and inversion need work.    Time 4    Period Weeks    Status On-going      PT LONG TERM GOAL #3   Title Pt will be able to ambulate  community distances [facility] gait pattern.    Baseline Improved but noticeable DF stiffness with higher speeds.    Time 4    Period Weeks    Status On-going      PT LONG TERM GOAL #4   Title Pt will be able to perform at least 30 minutes of standing ADL's with overall less than 3/10 pain    Status Achieved                 Plan - 05/05/20 0803    Clinical Impression Statement Her ankle ROM has improved but continues to struggle with PF strength. Gave her strongest Tband to take home for more PF strengthening at home along with more heel raises at home. Used vaso today due to her increased overall swelling in her lower leg and ankle. Continue POC    Examination-Activity Limitations Carry;Stairs;Squat;Transfers;Stand;Lift    Examination-Participation Restrictions Cleaning;Community Activity;Driving;Shop;Laundry    Stability/Clinical Decision Making Evolving/Moderate complexity    Rehab Potential Good    PT Frequency 2x / week    PT Duration 8 weeks    PT Treatment/Interventions ADLs/Self Care Home Management;Aquatic Therapy;Cryotherapy;Electrical Stimulation;Iontophoresis 4mg /ml Dexamethasone;Moist Heat;Ultrasound;Gait training;Stair training;Therapeutic activities;Therapeutic exercise;Balance training;Neuromuscular re-education;Patient/family education;Manual techniques;Passive range of motion;Dry needling;Taping;Joint Manipulations    PT Next Visit Plan Continue to improve ROM, PF strength and SL balance control.    PT Home Exercise Plan Access Code: [email or web address]    Consulted and Agree with Plan of Care Patient           Patient will benefit from skilled therapeutic intervention in order to improve the following deficits and impairments:  Abnormal gait, Decreased activity tolerance, Difficulty walking,  Decreased strength, Hypomobility, Impaired flexibility, Pain  Visit Diagnosis: Pain in left ankle and joints of left foot  Stiffness of left ankle, not elsewhere classified  Other abnormalities of gait and  mobility     Problem List Patient Active Problem List   Diagnosis Date Noted  . Bimalleolar ankle fracture, left, closed, initial encounter   . Allergic reaction 06/16/2019  . Hyperglycemia 06/16/2019  . Postmenopausal symptoms 01/20/2019  . Viral URI with cough 01/20/2019  . Patellofemoral syndrome of both knees 09/09/2017  . Osteochondroma 05/31/2015  . Allergic rhinitis 03/24/2015  . Obesity 02/25/2015  . Routine general medical examination at a health care facility 02/25/2015    Silvestre Mesi 05/05/2020, 8:06 AM  [facility] Physical Therapy [address] [place], [place], [address] Phone: [phone]   Fax:  [phone]  Name: Rachel Graham MRN: [id number] Date of Birth: [birth year]

## 2020-05-06 ENCOUNTER — Encounter: Payer: 59 | Admitting: Physical Therapy

## 2020-05-09 ENCOUNTER — Other Ambulatory Visit: Payer: Self-pay

## 2020-05-09 ENCOUNTER — Ambulatory Visit (INDEPENDENT_AMBULATORY_CARE_PROVIDER_SITE_OTHER): Payer: 59 | Admitting: Physical Therapy

## 2020-05-09 DIAGNOSIS — R2689 Other abnormalities of gait and mobility: Secondary | ICD-10-CM

## 2020-05-09 DIAGNOSIS — M25572 Pain in left ankle and joints of left foot: Secondary | ICD-10-CM | POA: Diagnosis not present

## 2020-05-09 DIAGNOSIS — M25672 Stiffness of left ankle, not elsewhere classified: Secondary | ICD-10-CM

## 2020-05-09 NOTE — Therapy (Signed)
[facility] Physical Therapy [address] [place], [place], [address] Phone: [phone]   Fax:  [phone]  Physical Therapy Treatment  Patient Details  Name: Rachel Graham MRN: [id number] Date of Birth: [birth year] Referring Provider (PT): Persons, Bevely Palmer, [place]   Encounter Date: 05/09/2020   PT End of Session - 05/09/20 1524    Visit Number 20    Number of Visits 26    Date for PT Re-Evaluation 06/03/20    Authorization Type [facility]    PT Start Time 1430    PT Stop Time 1508    PT Time Calculation (min) 38 min    Activity Tolerance Patient tolerated treatment well    Behavior During Therapy [facility] for tasks assessed/performed           Past Medical History:  Diagnosis Date  . Blood in stool   . Contact lens/glasses fitting    wears glases or contacts    Past Surgical History:  Procedure Laterality Date  . DIAGNOSTIC LAPAROSCOPY  2010   lt ovarian cyst-LOA  . fibroids surgery  2008   d/c hysteroscopy  . GANGLION CYST EXCISION Right 04/27/2014   Procedure: RIGHT WRIST EXCISION OF MASS ;  Surgeon: Tennis Must, MD;  Location: [place];  Service: Orthopedics;  Laterality: Right;  . INCISION AND DRAINAGE ABSCESS     buttocks- "boil"  . ORIF ANKLE FRACTURE Left 12/04/2019   Procedure: OPEN REDUCTION INTERNAL FIXATION (ORIF) LEFT BIMALLEOLAR ANKLE FRACTURE;  Surgeon: Newt Minion, MD;  Location: [place];  Service: Orthopedics;  Laterality: Left;    There were no vitals filed for this visit.   Subjective Assessment - 05/09/20 1523    Subjective ankle not very swollen today, pain is low    Pertinent History S/P ORIF 12/04/19    Limitations Lifting;Standing;Walking;House hold activities    How long can you stand comfortably? 5-10 min    Diagnostic tests most recent XR 01/05/20 "demonstrate well-maintained alignment through the mortise of the ankle.  Hardware is intact and in place there does appear to be some interval healing"     Patient Stated Goals get back to normal with walking and ADL's    Pain Onset More than a month ago              [facility] PT Assessment - 05/09/20 0001      Assessment   Medical Diagnosis Pain Lt ankle and Lt foot S/P ORIF 1/8/2    Referring Provider (PT) Persons, Bevely Palmer, PA      ROM / Strength   AROM / PROM / Strength Strength      Strength   Left Ankle Dorsiflexion 5/5    Left Ankle Plantar Flexion 3+/5    Left Ankle Inversion 4/5    Left Ankle Eversion 5/5                         [facility] Adult PT Treatment/Exercise - 05/09/20 0001      Ankle Exercises: Stretches   Gastroc Stretch 4 reps;30 seconds    Gastroc Stretch Limitations slantboard    Other Stretch DF lunge stretch MWM using strap on Lt talus on slantboard 5 sec X 15 reps      Ankle Exercises: Aerobic   Nustep 8 min L7      Ankle Exercises: Standing   Other Standing Ankle Exercises step downs then back up 8 inch step lateral 2X10 and fwd 2X10  for Lt .    Other Standing Ankle Exercises SL heel raises on Lt with UE support to offload some due to weakness 2X10. Then eccentric heel raise up with both and down with Lt only X 15 then bilat Heel toe raises of step 2X10      Ankle Exercises: Supine   T-Band blue X 30 reps all planes      Ankle Exercises: Machines for Strengthening   Cybex Leg Press SL calf raises 50 lbs Lt leg only 3X15 seat laid back. Double leg press 125 lbs 3x10                    PT Short Term Goals - 04/12/20 1638      PT SHORT TERM GOAL #1   Title Pt will be I and compliant with HEP.    Time 4    Period Weeks    Status Achieved    Target Date 02/09/20             PT Long Term Goals - 04/12/20 1639      PT LONG TERM GOAL #1   Title Pt will improve Lt ankle ROM to [facility]. (Target for all goals 2 months 03/08/20)    Baseline Stiff heel cords.    Time 4    Period Weeks    Status On-going      PT LONG TERM GOAL #2   Title Pt will improve Lt ankle strength to  5/5 to improve function    Baseline Progress but plantar flexion and inversion need work.    Time 4    Period Weeks    Status On-going      PT LONG TERM GOAL #3   Title Pt will be able to ambulate  community distances [facility] gait pattern.    Baseline Improved but noticeable DF stiffness with higher speeds.    Time 4    Period Weeks    Status On-going      PT LONG TERM GOAL #4   Title Pt will be able to perform at least 30 minutes of standing ADL's with overall less than 3/10 pain    Status Achieved                 Plan - 05/09/20 1526    Clinical Impression Statement some improvement in ankle PF strength noted today but still weak with this affecting gait. PT will continue to improve this as able.    Examination-Activity Limitations Carry;Stairs;Squat;Transfers;Stand;Lift    Examination-Participation Restrictions Cleaning;Community Activity;Driving;Shop;Laundry    Stability/Clinical Decision Making Evolving/Moderate complexity    Rehab Potential Good    PT Frequency 2x / week    PT Duration 8 weeks    PT Treatment/Interventions ADLs/Self Care Home Management;Aquatic Therapy;Cryotherapy;Electrical Stimulation;Iontophoresis 4mg /ml Dexamethasone;Moist Heat;Ultrasound;Gait training;Stair training;Therapeutic activities;Therapeutic exercise;Balance training;Neuromuscular re-education;Patient/family education;Manual techniques;Passive range of motion;Dry needling;Taping;Joint Manipulations    PT Next Visit Plan Continue to improve ROM, PF strength and SL balance control.    PT Home Exercise Plan Access Code: [email or web address]    Consulted and Agree with Plan of Care Patient           Patient will benefit from skilled therapeutic intervention in order to improve the following deficits and impairments:  Abnormal gait, Decreased activity tolerance, Difficulty walking, Decreased strength, Hypomobility, Impaired flexibility, Pain  Visit Diagnosis: Pain in left ankle and joints of left  foot  Stiffness of left ankle, not elsewhere classified  Other abnormalities of gait and mobility  Problem List Patient Active Problem List   Diagnosis Date Noted  . Bimalleolar ankle fracture, left, closed, initial encounter   . Allergic reaction 06/16/2019  . Hyperglycemia 06/16/2019  . Postmenopausal symptoms 01/20/2019  . Viral URI with cough 01/20/2019  . Patellofemoral syndrome of both knees 09/09/2017  . Osteochondroma 05/31/2015  . Allergic rhinitis 03/24/2015  . Obesity 02/25/2015  . Routine general medical examination at a health care facility 02/25/2015    Rachel Graham 05/09/2020, 3:27 PM  [facility] Physical Therapy [address] [place], [place], [address] Phone: [phone]   Fax:  [phone]  Name: Rachel Graham MRN: [id number] Date of Birth: 08-Mar-1971

## 2020-05-11 ENCOUNTER — Other Ambulatory Visit: Payer: Self-pay

## 2020-05-11 ENCOUNTER — Ambulatory Visit (INDEPENDENT_AMBULATORY_CARE_PROVIDER_SITE_OTHER): Payer: 59 | Admitting: Physical Therapy

## 2020-05-11 DIAGNOSIS — M25572 Pain in left ankle and joints of left foot: Secondary | ICD-10-CM | POA: Diagnosis not present

## 2020-05-11 DIAGNOSIS — M25672 Stiffness of left ankle, not elsewhere classified: Secondary | ICD-10-CM | POA: Diagnosis not present

## 2020-05-11 DIAGNOSIS — R2689 Other abnormalities of gait and mobility: Secondary | ICD-10-CM | POA: Diagnosis not present

## 2020-05-11 NOTE — Therapy (Signed)
[facility] Physical Therapy [address] [place], [place], [address] Phone: [phone]   Fax:  [phone]  Physical Therapy Treatment  Patient Details  Name: Rachel Graham MRN: [id number] Date of Birth: 1971-11-17 Referring Provider (PT): Persons, Bevely Palmer, [place]   Encounter Date: 05/11/2020   PT End of Session - 05/11/20 1444    Visit Number 21    Number of Visits 26    Date for PT Re-Evaluation 06/03/20    Authorization Type [facility]    PT Start Time 1430    PT Stop Time 1500    PT Time Calculation (min) 30 min    Activity Tolerance Patient tolerated treatment well    Behavior During Therapy [facility] for tasks assessed/performed           Past Medical History:  Diagnosis Date  . Blood in stool   . Contact lens/glasses fitting    wears glases or contacts    Past Surgical History:  Procedure Laterality Date  . DIAGNOSTIC LAPAROSCOPY  2010   lt ovarian cyst-LOA  . fibroids surgery  2008   d/c hysteroscopy  . GANGLION CYST EXCISION Right 04/27/2014   Procedure: RIGHT WRIST EXCISION OF MASS ;  Surgeon: Tennis Must, MD;  Location: [place];  Service: Orthopedics;  Laterality: Right;  . INCISION AND DRAINAGE ABSCESS     buttocks- "boil"  . ORIF ANKLE FRACTURE Left 12/04/2019   Procedure: OPEN REDUCTION INTERNAL FIXATION (ORIF) LEFT BIMALLEOLAR ANKLE FRACTURE;  Surgeon: Newt Minion, MD;  Location: [place];  Service: Orthopedics;  Laterality: Left;    There were no vitals filed for this visit.   Subjective Assessment - 05/11/20 1443    Subjective ankle is doing good, not much pain, not that swollen, not sore    Pertinent History S/P ORIF 12/04/19    Limitations Lifting;Standing;Walking;House hold activities    How long can you stand comfortably? 5-10 min    Diagnostic tests most recent XR 01/05/20 "demonstrate well-maintained alignment through the mortise of the ankle.  Hardware is intact and in place there does appear to be some  interval healing"    Patient Stated Goals get back to normal with walking and ADL's    Pain Onset More than a month ago              [facility] PT Assessment - 05/11/20 0001      Assessment   Medical Diagnosis Pain Lt ankle and Lt foot S/P ORIF 1/8/2    Referring Provider (PT) Persons, Bevely Palmer, [place]      Strength   Left Ankle Dorsiflexion 5/5    Left Ankle Plantar Flexion 4/5    Left Ankle Inversion 4+/5    Left Ankle Eversion 4+/5            [facility] Adult PT Treatment/Exercise - 05/11/20 0001      Ankle Exercises: Stretches   Slant Board Stretch Limitations 30 sec holds 3 reps gastroc and 3 reps soleus.    Other Stretch DF lunge stretch with Lt foot on slantboard 10 sec X 10      Ankle Exercises: Aerobic   Nustep L7 X 5 min      Ankle Exercises: Standing   Other Standing Ankle Exercises 8 inch step for step ups fwd and lateral X 15 ea    Other Standing Ankle Exercises SL heel raises on Lt with UE support to offload some due to weakness 2X10. Then eccentric heel raise up  with both and down with Lt only X 20 then bilat Heel toe rasies X 20      Ankle Exercises: Supine   T-Band blue X 20 reps all planes except plantar flexion black X 30 with slow eccentric                    PT Short Term Goals - 04/12/20 1638      PT SHORT TERM GOAL #1   Title Pt will be I and compliant with HEP.    Time 4    Period Weeks    Status Achieved    Target Date 02/09/20             PT Long Term Goals - 04/12/20 1639      PT LONG TERM GOAL #1   Title Pt will improve Lt ankle ROM to [facility]. (Target for all goals 2 months 03/08/20)    Baseline Stiff heel cords.    Time 4    Period Weeks    Status On-going      PT LONG TERM GOAL #2   Title Pt will improve Lt ankle strength to 5/5 to improve function    Baseline Progress but plantar flexion and inversion need work.    Time 4    Period Weeks    Status On-going      PT LONG TERM GOAL #3   Title Pt will be able to ambulate   community distances [facility] gait pattern.    Baseline Improved but noticeable DF stiffness with higher speeds.    Time 4    Period Weeks    Status On-going      PT LONG TERM GOAL #4   Title Pt will be able to perform at least 30 minutes of standing ADL's with overall less than 3/10 pain    Status Achieved                 Plan - 05/11/20 1500    Clinical Impression Statement She has made improvements in ankle strength noted, see above for details. Still with some weakness and will continue to benefit from PT.    Examination-Activity Limitations Carry;Stairs;Squat;Transfers;Stand;Lift    Examination-Participation Restrictions Cleaning;Community Activity;Driving;Shop;Laundry    Stability/Clinical Decision Making Evolving/Moderate complexity    Rehab Potential Good    PT Frequency 2x / week    PT Duration 8 weeks    PT Treatment/Interventions ADLs/Self Care Home Management;Aquatic Therapy;Cryotherapy;Electrical Stimulation;Iontophoresis 4mg /ml Dexamethasone;Moist Heat;Ultrasound;Gait training;Stair training;Therapeutic activities;Therapeutic exercise;Balance training;Neuromuscular re-education;Patient/family education;Manual techniques;Passive range of motion;Dry needling;Taping;Joint Manipulations    PT Next Visit Plan Continue to improve ROM, PF strength and SL balance control.    PT Home Exercise Plan Access Code: [email or web address]    Consulted and Agree with Plan of Care Patient           Patient will benefit from skilled therapeutic intervention in order to improve the following deficits and impairments:  Abnormal gait, Decreased activity tolerance, Difficulty walking, Decreased strength, Hypomobility, Impaired flexibility, Pain  Visit Diagnosis: Pain in left ankle and joints of left foot  Stiffness of left ankle, not elsewhere classified  Other abnormalities of gait and mobility     Problem List Patient Active Problem List   Diagnosis Date Noted  . Bimalleolar ankle fracture,  left, closed, initial encounter   . Allergic reaction 06/16/2019  . Hyperglycemia 06/16/2019  . Postmenopausal symptoms 01/20/2019  . Viral URI with cough 01/20/2019  . Patellofemoral syndrome of both knees 09/09/2017  .  Osteochondroma 05/31/2015  . Allergic rhinitis 03/24/2015  . Obesity 02/25/2015  . Routine general medical examination at a health care facility 02/25/2015    Rachel Graham 05/11/2020, 3:01 PM  [facility] Physical Therapy [address] [place], [place], [address] Phone: [phone]   Fax:  [phone]  Name: Rachel Graham MRN: [id number] Date of Birth: August 03, 1971

## 2020-05-16 ENCOUNTER — Encounter: Payer: 59 | Admitting: Physical Therapy

## 2020-05-17 ENCOUNTER — Other Ambulatory Visit: Payer: Self-pay

## 2020-05-17 ENCOUNTER — Ambulatory Visit (INDEPENDENT_AMBULATORY_CARE_PROVIDER_SITE_OTHER): Payer: 59 | Admitting: Physical Therapy

## 2020-05-17 DIAGNOSIS — M25672 Stiffness of left ankle, not elsewhere classified: Secondary | ICD-10-CM

## 2020-05-17 DIAGNOSIS — R2689 Other abnormalities of gait and mobility: Secondary | ICD-10-CM | POA: Diagnosis not present

## 2020-05-17 DIAGNOSIS — M25572 Pain in left ankle and joints of left foot: Secondary | ICD-10-CM | POA: Diagnosis not present

## 2020-05-17 NOTE — Therapy (Signed)
[facility] Physical Therapy [address] [place], [place], [address] Phone: [phone]   Fax:  [phone]  Physical Therapy Treatment  Patient Details  Name: Rachel Graham MRN: [id number] Date of Birth: [birth year] Referring Provider (PT): Persons, Bevely Palmer, [place]   Encounter Date: 05/17/2020   PT End of Session - 05/17/20 1519    Visit Number 22    Number of Visits 26    Date for PT Re-Evaluation 06/03/20    Authorization Type [facility]    PT Start Time 1435    PT Stop Time 1515    PT Time Calculation (min) 40 min    Activity Tolerance Patient tolerated treatment well    Behavior During Therapy [facility] for tasks assessed/performed           Past Medical History:  Diagnosis Date  . Blood in stool   . Contact lens/glasses fitting    wears glases or contacts    Past Surgical History:  Procedure Laterality Date  . DIAGNOSTIC LAPAROSCOPY  2010   lt ovarian cyst-LOA  . fibroids surgery  2008   d/c hysteroscopy  . GANGLION CYST EXCISION Right 04/27/2014   Procedure: RIGHT WRIST EXCISION OF MASS ;  Surgeon: Tennis Must, MD;  Location: [place];  Service: Orthopedics;  Laterality: Right;  . INCISION AND DRAINAGE ABSCESS     buttocks- "boil"  . ORIF ANKLE FRACTURE Left 12/04/2019   Procedure: OPEN REDUCTION INTERNAL FIXATION (ORIF) LEFT BIMALLEOLAR ANKLE FRACTURE;  Surgeon: Newt Minion, MD;  Location: [place];  Service: Orthopedics;  Laterality: Left;    There were no vitals filed for this visit.   Subjective Assessment - 05/17/20 1518    Subjective ankle is doing well, no new complaints. A little pain but I can manage    Pertinent History S/P ORIF 12/04/19    Limitations Lifting;Standing;Walking;House hold activities    How long can you stand comfortably? 5-10 min    Diagnostic tests most recent XR 01/05/20 "demonstrate well-maintained alignment through the mortise of the ankle.  Hardware is intact and in place there does appear to be  some interval healing"    Patient Stated Goals get back to normal with walking and ADL's    Pain Onset More than a month ago             [facility] Adult PT Treatment/Exercise - 05/17/20 0001      Neuro Re-ed    Neuro Re-ed Details  balance on BOSU feet apart progressed to feet together, then squats on BOSU X 15, and marches on bosu X 10 bilat      Ankle Exercises: Stretches   Slant Board Stretch Limitations 30 sec holds 2 reps gastroc and 2 reps soleus.    Other Stretch DF lunge stretch with Lt foot on slantboard 10 sec X 10      Ankle Exercises: Machines for Strengthening   Cybex Leg Press SL calf raises 50 lbs Lt leg only 3X15. Double leg press 125 lbs 2X15      Ankle Exercises: Supine   T-Band blue X 20 reps all planes except plantar flexion black X 30 with slow eccentric      Ankle Exercises: Standing   Other Standing Ankle Exercises step down forward from 4 inch step 2X10    Other Standing Ankle Exercises SL heel raises on Lt with UE support to offload some due to weakness 2X10. Then eccentric heel raise up with both and down with Lt  only X 20 then bilat Heel toe rasies X 20             PT Short Term Goals - 04/12/20 1638      PT SHORT TERM GOAL #1   Title Pt will be I and compliant with HEP.    Time 4    Period Weeks    Status Achieved    Target Date 02/09/20             PT Long Term Goals - 04/12/20 1639      PT LONG TERM GOAL #1   Title Pt will improve Lt ankle ROM to [facility]. (Target for all goals 2 months 03/08/20)    Baseline Stiff heel cords.    Time 4    Period Weeks    Status On-going      PT LONG TERM GOAL #2   Title Pt will improve Lt ankle strength to 5/5 to improve function    Baseline Progress but plantar flexion and inversion need work.    Time 4    Period Weeks    Status On-going      PT LONG TERM GOAL #3   Title Pt will be able to ambulate  community distances [facility] gait pattern.    Baseline Improved but noticeable DF stiffness with higher  speeds.    Time 4    Period Weeks    Status On-going      PT LONG TERM GOAL #4   Title Pt will be able to perform at least 30 minutes of standing ADL's with overall less than 3/10 pain    Status Achieved                 Plan - 05/17/20 1525    Clinical Impression Statement She is overall doing well, only very mild limp due to PF weakness mostly. Continued to address this in session. She has one more visit scheduled to will reassess next visit for DC vs continuing PT.    Examination-Activity Limitations Carry;Stairs;Squat;Transfers;Stand;Lift    Examination-Participation Restrictions Cleaning;Community Activity;Driving;Shop;Laundry    Stability/Clinical Decision Making Evolving/Moderate complexity    Rehab Potential Good    PT Frequency 2x / week    PT Duration 8 weeks    PT Treatment/Interventions ADLs/Self Care Home Management;Aquatic Therapy;Cryotherapy;Electrical Stimulation;Iontophoresis 4mg /ml Dexamethasone;Moist Heat;Ultrasound;Gait training;Stair training;Therapeutic activities;Therapeutic exercise;Balance training;Neuromuscular re-education;Patient/family education;Manual techniques;Passive range of motion;Dry needling;Taping;Joint Manipulations    PT Next Visit Plan Continue to improve ROM, PF strength and SL balance control.    PT Home Exercise Plan Access Code: [email or web address]    Consulted and Agree with Plan of Care Patient           Patient will benefit from skilled therapeutic intervention in order to improve the following deficits and impairments:  Abnormal gait, Decreased activity tolerance, Difficulty walking, Decreased strength, Hypomobility, Impaired flexibility, Pain  Visit Diagnosis: Pain in left ankle and joints of left foot  Stiffness of left ankle, not elsewhere classified  Other abnormalities of gait and mobility     Problem List Patient Active Problem List   Diagnosis Date Noted  . Bimalleolar ankle fracture, left, closed, initial encounter   .  Allergic reaction 06/16/2019  . Hyperglycemia 06/16/2019  . Postmenopausal symptoms 01/20/2019  . Viral URI with cough 01/20/2019  . Patellofemoral syndrome of both knees 09/09/2017  . Osteochondroma 05/31/2015  . Allergic rhinitis 03/24/2015  . Obesity 02/25/2015  . Routine general medical examination at a health care facility 02/25/2015  Debbe Odea, PT,DPT 05/17/2020, 3:45 PM  [facility] Physical Therapy [address] [place], [place], [address] Phone: [phone]   Fax:  [phone]  Name: RETAJ HILBUN MRN: [id number] Date of Birth: [birth year]

## 2020-05-18 ENCOUNTER — Encounter: Payer: 59 | Admitting: Rehabilitative and Restorative Service Providers"

## 2020-05-23 ENCOUNTER — Ambulatory Visit (INDEPENDENT_AMBULATORY_CARE_PROVIDER_SITE_OTHER): Payer: 59 | Admitting: Physical Therapy

## 2020-05-23 ENCOUNTER — Encounter: Payer: Self-pay | Admitting: Physical Therapy

## 2020-05-23 ENCOUNTER — Encounter: Payer: 59 | Admitting: Physical Therapy

## 2020-05-23 ENCOUNTER — Other Ambulatory Visit: Payer: Self-pay

## 2020-05-23 DIAGNOSIS — R2689 Other abnormalities of gait and mobility: Secondary | ICD-10-CM

## 2020-05-23 DIAGNOSIS — M25572 Pain in left ankle and joints of left foot: Secondary | ICD-10-CM

## 2020-05-23 DIAGNOSIS — M25672 Stiffness of left ankle, not elsewhere classified: Secondary | ICD-10-CM | POA: Diagnosis not present

## 2020-05-23 NOTE — Therapy (Signed)
[facility] [address] [place], [place], [address] Phone: [phone]   Fax:  [phone]  Physical Therapy Treatment/Discharge  PHYSICAL THERAPY DISCHARGE SUMMARY  Visits from Start of Care: 23  Current functional level related to goals / functional outcomes: See below   Remaining deficits: See below   Education / Equipment: HEP  Plan: Patient agrees to discharge.  Patient goals were met. Patient is being discharged due to being pleased with the current functional level.  ?????       Patient Details  Name: Rachel Graham MRN: [id number] Date of Birth: 1971/10/08 Referring Provider (PT): Persons, Bevely Palmer, [place]   Encounter Date: 05/23/2020   PT End of Session - 05/23/20 1522    Visit Number 23    Number of Visits 26    Date for PT Re-Evaluation 06/03/20    Authorization Type [facility]    PT Start Time 1430    PT Stop Time 1500    PT Time Calculation (min) 30 min    Activity Tolerance Patient tolerated treatment well    Behavior During Therapy [facility] for tasks assessed/performed           Past Medical History:  Diagnosis Date  . Blood in stool   . Contact lens/glasses fitting    wears glases or contacts    Past Surgical History:  Procedure Laterality Date  . DIAGNOSTIC LAPAROSCOPY  2010   lt ovarian cyst-LOA  . fibroids surgery  2008   d/c hysteroscopy  . GANGLION CYST EXCISION Right 04/27/2014   Procedure: RIGHT WRIST EXCISION OF MASS ;  Surgeon: Tennis Must, MD;  Location: [place];  Service: Orthopedics;  Laterality: Right;  . INCISION AND DRAINAGE ABSCESS     buttocks- "boil"  . ORIF ANKLE FRACTURE Left 12/04/2019   Procedure: OPEN REDUCTION INTERNAL FIXATION (ORIF) LEFT BIMALLEOLAR ANKLE FRACTURE;  Surgeon: Newt Minion, MD;  Location: [place];  Service: Orthopedics;  Laterality: Left;    There were no vitals filed for this visit.   Subjective Assessment - 05/23/20 1519    Subjective  ankle is good, feels ready to discharge to HEP    Pertinent History S/P ORIF 12/04/19    Limitations Lifting;Standing;Walking;House hold activities    How long can you stand comfortably? 5-10 min    Diagnostic tests most recent XR 01/05/20 "demonstrate well-maintained alignment through the mortise of the ankle.  Hardware is intact and in place there does appear to be some interval healing"    Patient Stated Goals get back to normal with walking and ADL's    Pain Onset More than a month ago              [facility] PT Assessment - 05/23/20 0001      Assessment   Medical Diagnosis Pain Lt ankle and Lt foot S/P ORIF 1/8/2    Referring Provider (PT) Persons, Bevely Palmer, [place]    Onset Date/Surgical Date 12/04/19      AROM   Left Ankle Dorsiflexion 5    Left Ankle Plantar Flexion --   [facility]   Left Ankle Inversion 20    Left Ankle Eversion 20      Strength   Left Ankle Dorsiflexion 5/5    Left Ankle Plantar Flexion 4/5    Left Ankle Inversion 4+/5    Left Ankle Eversion 5/5  [facility] PT Treatment/Exercise - 05/23/20 0001      Ankle Exercises: Stretches   Slant Board Stretch Limitations 30 sec holds 2 reps gastroc and 2 reps soleus.    Other Stretch DF lunge stretch with Lt foot on slantboard 10 sec X 10      Ankle Exercises: Supine   T-Band blue X 20 reps of INV and PF      Ankle Exercises: Standing   Other Standing Ankle Exercises step down forward from 4 inch step 2X10    Other Standing Ankle Exercises SL heel raises on Lt with UE support to offload some due to weakness 2X10. Then eccentric heel raise up with both and down with Lt only X 20 then bilat Heel toe rasies X 20      Ankle Exercises: Aerobic   Nustep L7 X 5 min                  PT Education - 05/23/20 1522    Education Details HEP revision and review    Person(s) Educated Patient    Methods Explanation;Demonstration;Verbal cues    Comprehension Verbalized  understanding;Returned demonstration            PT Short Term Goals - 05/23/20 1525      PT SHORT TERM GOAL #1   Title Pt will be I and compliant with HEP.    Time 4    Period Weeks    Status Achieved    Target Date 02/09/20             PT Long Term Goals - 05/23/20 1523      PT LONG TERM GOAL #1   Title Pt will improve Lt ankle ROM to [facility]. (Target for all goals 2 months 03/08/20)    Baseline Stiff heel cords.    Time 4    Period Weeks    Status Achieved      PT LONG TERM GOAL #2   Title Pt will improve Lt ankle strength to 5/5 to improve function    Baseline Progress but plantar flexion and inversion need work.    Time 4    Period Weeks    Status Partially Met      PT LONG TERM GOAL #3   Title Pt will be able to ambulate  community distances [facility] gait pattern.    Time 4    Period Weeks    Status Achieved      PT LONG TERM GOAL #4   Title Pt will be able to perform at least 30 minutes of standing ADL's with overall less than 3/10 pain    Status Achieved                 Plan - 05/23/20 1525    Clinical Impression Statement She has met 5/6 PT goals and is only missing Lt ankle plantarflexion strength. She feels confident discharging to HEP and PT agrees.    Examination-Activity Limitations Carry;Stairs;Squat;Transfers;Stand;Lift    Examination-Participation Restrictions Cleaning;Community Activity;Driving;Shop;Laundry    Stability/Clinical Decision Making Evolving/Moderate complexity    Rehab Potential Good    PT Frequency 2x / week    PT Duration 8 weeks    PT Treatment/Interventions ADLs/Self Care Home Management;Aquatic Therapy;Cryotherapy;Electrical Stimulation;Iontophoresis 43m/ml Dexamethasone;Moist Heat;Ultrasound;Gait training;Stair training;Therapeutic activities;Therapeutic exercise;Balance training;Neuromuscular re-education;Patient/family education;Manual techniques;Passive range of motion;Dry needling;Taping;Joint Manipulations    PT Next Visit  Plan Discharge today    PT Home Exercise Plan Access Code: [email or web address]   Consulted and Agree  with Plan of Care Patient           Patient will benefit from skilled therapeutic intervention in order to improve the following deficits and impairments:  Abnormal gait, Decreased activity tolerance, Difficulty walking, Decreased strength, Hypomobility, Impaired flexibility, Pain  Visit Diagnosis: Pain in left ankle and joints of left foot  Stiffness of left ankle, not elsewhere classified  Other abnormalities of gait and mobility     Problem List Patient Active Problem List   Diagnosis Date Noted  . Bimalleolar ankle fracture, left, closed, initial encounter   . Allergic reaction 06/16/2019  . Hyperglycemia 06/16/2019  . Postmenopausal symptoms 01/20/2019  . Viral URI with cough 01/20/2019  . Patellofemoral syndrome of both knees 09/09/2017  . Osteochondroma 05/31/2015  . Allergic rhinitis 03/24/2015  . Obesity 02/25/2015  . Routine general medical examination at a health care facility 02/25/2015    Debbe Odea, PT,DPT 05/23/2020, 3:29 PM  [facility] [address] [place], [place], [address] Phone: [phone]   Fax:  [phone]  Name: Rachel Graham MRN: [id number] Date of Birth: [birth year]

## 2020-12-19 DIAGNOSIS — Z0100 Encounter for examination of eyes and vision without abnormal findings: Secondary | ICD-10-CM | POA: Diagnosis not present

## 2021-02-06 ENCOUNTER — Other Ambulatory Visit (HOSPITAL_COMMUNITY): Payer: Self-pay | Admitting: Emergency Medicine

## 2021-02-06 ENCOUNTER — Ambulatory Visit (HOSPITAL_COMMUNITY)
Admission: EM | Admit: 2021-02-06 | Discharge: 2021-02-06 | Disposition: A | Discharge status: Home / Self Care | Payer: 59 | Attending: Emergency Medicine | Admitting: Emergency Medicine

## 2021-02-06 ENCOUNTER — Encounter (HOSPITAL_COMMUNITY): Payer: Self-pay

## 2021-02-06 ENCOUNTER — Other Ambulatory Visit: Payer: Self-pay

## 2021-02-06 ENCOUNTER — Other Ambulatory Visit (HOSPITAL_BASED_OUTPATIENT_CLINIC_OR_DEPARTMENT_OTHER): Payer: Self-pay

## 2021-02-06 DIAGNOSIS — T7840XA Allergy, unspecified, initial encounter: Secondary | ICD-10-CM | POA: Diagnosis not present

## 2021-02-06 MED ORDER — OLOPATADINE HCL 0.1 % OP SOLN
1.0000 [drp] | Freq: Two times a day (BID) | OPHTHALMIC | 12 refills | Status: DC
  Filled 2021-02-06: qty 5, 50d supply, fill #0

## 2021-02-06 MED ORDER — PREDNISONE 20 MG PO TABS: 20.0000 mg | ORAL_TABLET | Freq: Every day | ORAL | 0 refills | Status: DC

## 2021-02-06 MED ORDER — PREDNISONE 20 MG PO TABS
20.0000 mg | ORAL_TABLET | Freq: Every day | ORAL | 0 refills | Status: DC
  Filled 2021-02-06: qty 5, 5d supply, fill #0

## 2021-02-06 MED ORDER — OLOPATADINE HCL 0.1 % OP SOLN: 1.0000 [drp] | Freq: Two times a day (BID) | OPHTHALMIC | 12 refills | Status: DC

## 2021-02-06 NOTE — ED Provider Notes (Signed)
[place]    CSN: [id number] Arrival date & time: 02/06/21  0846      History   Chief Complaint Chief Complaint  Patient presents with   Allergic Reaction    HPI Rachel Graham is a 50 y.o. female.   Patient presents with rash spread throughout scalp and right eye swelling and itching that began after dying hair on Friday evening. Scalp and eye itching all day Saturday. Swelling began Sunday. Denies blurred vision, eye pain, discharge, diplopia and spots, Shortness of breath, difficulty breathing, headache, fever. Attempted benadryl once with minimal relief. Scalp felt a little better after washing hair.   Past Medical History:  Diagnosis Date   Blood in stool    Contact lens/glasses fitting    wears glases or contacts    Patient Active Problem List   Diagnosis Date Noted   Bimalleolar ankle fracture, left, closed, initial encounter    Allergic reaction 06/16/2019   Hyperglycemia 06/16/2019   Postmenopausal symptoms 01/20/2019   Viral URI with cough 01/20/2019   Patellofemoral syndrome of both knees 09/09/2017   Osteochondroma 05/31/2015   Allergic rhinitis 03/24/2015   Obesity 02/25/2015   Routine general medical examination at a health care facility 02/25/2015    Past Surgical History:  Procedure Laterality Date   DIAGNOSTIC LAPAROSCOPY  2010   lt ovarian cyst-LOA   fibroids surgery  2008   d/c hysteroscopy   GANGLION CYST EXCISION Right 04/27/2014   Procedure: RIGHT WRIST EXCISION OF MASS ;  Surgeon: Tennis Must, MD;  Location: [place];  Service: Orthopedics;  Laterality: Right;   INCISION AND DRAINAGE ABSCESS     buttocks- "boil"   ORIF ANKLE FRACTURE Left 12/04/2019   Procedure: OPEN REDUCTION INTERNAL FIXATION (ORIF) LEFT BIMALLEOLAR ANKLE FRACTURE;  Surgeon: Newt Minion, MD;  Location: [place];  Service: Orthopedics;  Laterality: Left;    OB History   No obstetric history on file.      Home  Medications    Prior to Admission medications   Medication Sig Start Date End Date Taking? Authorizing Provider  olopatadine (PATANOL) 0.1 % ophthalmic solution Place 1 drop into the right eye 2 (two) times daily. 02/06/21  Yes Everly Rubalcava R, NP  predniSONE (DELTASONE) 20 MG tablet Take 1 tablet (20 mg total) by mouth daily. 02/08/21  Yes Terease Marcotte, Leitha Schuller, NP  erythromycin ophthalmic ointment Place a 1/2 inch ribbon of ointment into the upper eyelid. 01/08/20   Darr, Edison Nasuti, PA-C  escitalopram (LEXAPRO) 10 MG tablet Take 1 tablet (10 mg total) by mouth daily. 01/20/19   Hoyt Koch, MD  fluticasone Asencion Islam) 50 MCG/ACT nasal spray Place 2 sprays into both nostrils daily. 01/20/19   Hoyt Koch, MD  montelukast (SINGULAIR) 10 MG tablet Take 1 tablet (10 mg total) by mouth at bedtime. 01/20/19   Hoyt Koch, MD    Family History Family History  Problem Relation Age of Onset   Diabetes Mother    Diabetes Father    Diabetes Sister    Diabetes Brother     Social History Social History   Tobacco Use   Smoking status: Never Smoker   Smokeless tobacco: Never Used  Scientific laboratory technician Use: Never used  Substance Use Topics   Alcohol use: Yes    Comment: occasional   Drug use: No     Allergies   Other   Review of Systems Review of Systems  Constitutional: Negative.  HENT: Negative.   Eyes: Negative.   Respiratory: Negative.   Cardiovascular: Negative.   Gastrointestinal: Negative.   Skin: Positive for rash.  Allergic/Immunologic: Negative.   Neurological: Negative.      Physical Exam Triage Vital Signs ED Triage Vitals  Enc Vitals Group     BP 02/06/21 0920 117/78     Pulse Rate 02/06/21 0920 64     Resp 02/06/21 0920 18     Temp 02/06/21 0920 98.2 F (36.8 C)     Temp src --      SpO2 02/06/21 0920 98 %     Weight --      Height --      Head Circumference --      Peak Flow --      Pain Score 02/06/21 0919 5     Pain Loc  --      Pain Edu? --      Excl. in [place]? --    No data found.  Updated Vital Signs BP 117/78    Pulse 64    Temp 98.2 F (36.8 C)    Resp 18    LMP 11/27/2016    SpO2 98%   Visual Acuity Right Eye Distance:   Left Eye Distance:   Bilateral Distance:    Right Eye Near:   Left Eye Near:    Bilateral Near:     Physical Exam Constitutional:      Appearance: Normal appearance. She is normal weight.  HENT:     Head: Normocephalic.  Eyes:     General: Vision grossly intact.        Right eye: No foreign body, discharge or hordeolum.     Extraocular Movements: Extraocular movements intact.     Conjunctiva/sclera: Conjunctivae normal.     Comments: Right eye upper and lower lid edema  Pulmonary:     Effort: Pulmonary effort is normal.  Skin:    Findings: No rash.     Comments: erythematous papular rash spread throughout scalp, along nape of neck, and frontal hairline    Neurological:     Mental Status: She is alert and oriented to person, place, and time. Mental status is at baseline.  Psychiatric:        Mood and Affect: Mood normal.        Behavior: Behavior normal.        Thought Content: Thought content normal.        Judgment: Judgment normal.      UC Treatments / Results  Labs (all labs ordered are listed, but only abnormal results are displayed) Labs Reviewed - No data to display  EKG   Radiology No results found.  Procedures Procedures (including critical care time)  Medications Ordered in UC Medications - No data to display  Initial Impression / Assessment and Plan / UC Course  I have reviewed the triage vital signs and the nursing notes.  Pertinent labs & imaging results that were available during my care of the patient were reviewed by me and considered in my medical decision making (see chart for details).  Allergic reaction  1. Benadryl 25 mg every 6 hours, otc daytime antihistamine daily if needed to work with benadryl doses starting upon  returning home 2. Prednisone 20 mg daily for 5 days starting 3/16 for if antihistamine treatment ineffective 3. patanol drops bid to right eye  4. Follow up for any changes to visual, opthalmology resource given to patient, verbalized understanding  Final Clinical  Impressions(s) / UC Diagnoses   Final diagnoses:  Allergic reaction, initial encounter     Discharge Instructions     Use one drop in right eye morning and evening   Take 25 mg benadryl every six hours while at home, can use claritin, zyrtec or xyzal daily while at work, Can use benadryl in evening   Can start steroid course on 02/08/2021 of one 20 mg pill daily for 5 days in antihistamine ineffective   Follow up if vision becomes blurry, eye becomes painful, start seeing spots, completely closes or drainage is noticed.    ED Prescriptions    Medication Sig Dispense Auth. Provider   olopatadine (PATANOL) 0.1 % ophthalmic solution Place 1 drop into the right eye 2 (two) times daily. 5 mL Ithan Touhey R, NP   predniSONE (DELTASONE) 20 MG tablet Take 1 tablet (20 mg total) by mouth daily. 5 tablet Hans Eden, NP     PDMP not reviewed this encounter.   Hans Eden, [place] 02/06/21 [phone]

## 2021-02-06 NOTE — Discharge Instructions (Addendum)
Use one drop in right eye morning and evening   Take 25 mg benadryl every six hours while at home, can use claritin, zyrtec or xyzal daily while at work, Can use benadryl in evening   Can start steroid course on 02/08/2021 of one 20 mg pill daily for 5 days in antihistamine ineffective   Follow up if vision becomes blurry, eye becomes painful, start seeing spots, completely closes or drainage is noticed.

## 2021-02-06 NOTE — ED Triage Notes (Signed)
Pt in with c/o eye swelling an itching that started Saturday after she used some hair dye  Pt used benadryl with no relief from sxs

## 2021-02-07 ENCOUNTER — Other Ambulatory Visit (HOSPITAL_BASED_OUTPATIENT_CLINIC_OR_DEPARTMENT_OTHER): Payer: Self-pay

## 2021-02-08 ENCOUNTER — Telehealth: Payer: Self-pay | Admitting: Internal Medicine

## 2021-02-08 NOTE — Telephone Encounter (Signed)
See below. She has been scheduled to be seen tomorrow at 3:20 pm.

## 2021-02-08 NOTE — Telephone Encounter (Signed)
Patient calling, states on Friday she got her hair done on Friday and Saturday morning her head started itching Saturday and Sunday her face was swollen. She has been taking benadryl but is still itching and has some swelling. Transferred to team health for further evaluation.    [phone]

## 2021-02-09 ENCOUNTER — Encounter: Payer: Self-pay | Admitting: Internal Medicine

## 2021-02-09 ENCOUNTER — Other Ambulatory Visit: Payer: Self-pay

## 2021-02-09 ENCOUNTER — Ambulatory Visit: Payer: 59 | Admitting: Internal Medicine

## 2021-02-09 DIAGNOSIS — T7840XA Allergy, unspecified, initial encounter: Secondary | ICD-10-CM | POA: Diagnosis not present

## 2021-02-09 MED ORDER — TRIAMCINOLONE ACETONIDE 0.1 % EX CREA: 1.0000 "application " | TOPICAL_CREAM | Freq: Two times a day (BID) | CUTANEOUS | 0 refills | Status: DC

## 2021-02-09 NOTE — Progress Notes (Unsigned)
   Subjective:   Patient ID: Rachel Graham, female    DOB: May 08, 1971, 50 y.o.   MRN: [id number]  HPI The patient is a 50 YO female coming in for itching scalp and face with rash. She did have hair treatment and next day with severe itching and rash on the face. Initially she had more swelling in the face and went to urgent care. They gave her benadryl and told her to start claritin which she did daily. They gave her rx for prednisone which she did not take after seeing the side effects. She has had improvement of the swelling and rash still present itching mild improvement but still present.   Review of Systems  Constitutional: Negative.   HENT: Negative.   Eyes: Negative.   Respiratory: Negative for cough, chest tightness and shortness of breath.   Cardiovascular: Negative for chest pain, palpitations and leg swelling.  Gastrointestinal: Negative for abdominal distention, abdominal pain, constipation, diarrhea, nausea and vomiting.  Musculoskeletal: Negative.   Skin: Positive for rash.       itching  Neurological: Negative.   Psychiatric/Behavioral: Negative.     Objective:  Physical Exam Constitutional:      Appearance: She is well-developed.  HENT:     Head: Normocephalic and atraumatic.  Cardiovascular:     Rate and Rhythm: Normal rate and regular rhythm.  Pulmonary:     Effort: Pulmonary effort is normal. No respiratory distress.     Breath sounds: Normal breath sounds. No wheezing or rales.  Abdominal:     General: Bowel sounds are normal. There is no distension.     Palpations: Abdomen is soft.     Tenderness: There is no abdominal tenderness. There is no rebound.  Musculoskeletal:     Cervical back: Normal range of motion.  Skin:    General: Skin is warm and dry.     Findings: Rash present.     Comments: Rash on scalp and upper forehead bilaterally  Neurological:     Mental Status: She is alert and oriented to person, place, and time.     Coordination:  Coordination normal.     Vitals:   02/09/21 1537  BP: 122/62  Pulse: 70  Resp: 18  Temp: 97.9 F (36.6 C)  TempSrc: Oral  SpO2: 100%  Weight: 215 lb 3.2 oz (97.6 kg)  Height: 5\' 3"  (1.6 m)    This visit occurred during the SARS-CoV-2 public health emergency.  Safety protocols were in place, including screening questions prior to the visit, additional usage of staff PPE, and extensive cleaning of exam room while observing appropriate contact time as indicated for disinfecting solutions.   Assessment & Plan:

## 2021-02-09 NOTE — Patient Instructions (Addendum)
Use claritin 1 pill 3 times a day to help with the itching.   Use the benadryl at night time to help with itching.  Avoid scratching.  We have sent in triamcinolone ointment to use daily on the scalp to help.

## 2021-02-10 ENCOUNTER — Encounter: Payer: Self-pay | Admitting: Internal Medicine

## 2021-02-10 NOTE — Assessment & Plan Note (Signed)
She elected not to take prednisone. Overall appears to be improving. Rx triamcinolone ointment for the scalp and forehead 1 week or less to use. Advised she can use benadryl at night time and claritin TID for itching during the day.

## 2021-03-22 DIAGNOSIS — Z6837 Body mass index (BMI) 37.0-37.9, adult: Secondary | ICD-10-CM | POA: Diagnosis not present

## 2021-03-22 DIAGNOSIS — Z1389 Encounter for screening for other disorder: Secondary | ICD-10-CM | POA: Diagnosis not present

## 2021-03-22 DIAGNOSIS — Z01419 Encounter for gynecological examination (general) (routine) without abnormal findings: Secondary | ICD-10-CM | POA: Diagnosis not present

## 2021-03-22 DIAGNOSIS — Z130 Encounter for screening for diseases of the blood and blood-forming organs and certain disorders involving the immune mechanism: Secondary | ICD-10-CM | POA: Diagnosis not present

## 2021-03-22 DIAGNOSIS — N951 Menopausal and female climacteric states: Secondary | ICD-10-CM | POA: Diagnosis not present

## 2021-03-22 DIAGNOSIS — Z124 Encounter for screening for malignant neoplasm of cervix: Secondary | ICD-10-CM | POA: Diagnosis not present

## 2021-03-22 DIAGNOSIS — E669 Obesity, unspecified: Secondary | ICD-10-CM | POA: Diagnosis not present

## 2021-03-22 DIAGNOSIS — Z1231 Encounter for screening mammogram for malignant neoplasm of breast: Secondary | ICD-10-CM | POA: Diagnosis not present

## 2021-03-30 ENCOUNTER — Other Ambulatory Visit: Payer: Self-pay | Admitting: Obstetrics and Gynecology

## 2021-03-30 DIAGNOSIS — R928 Other abnormal and inconclusive findings on diagnostic imaging of breast: Secondary | ICD-10-CM

## 2021-03-31 ENCOUNTER — Ambulatory Visit (INDEPENDENT_AMBULATORY_CARE_PROVIDER_SITE_OTHER): Payer: 59 | Admitting: Internal Medicine

## 2021-03-31 ENCOUNTER — Encounter: Payer: Self-pay | Admitting: Internal Medicine

## 2021-03-31 ENCOUNTER — Other Ambulatory Visit: Payer: Self-pay

## 2021-03-31 VITALS — BP 124/70 | HR 61 | Temp 98.2°F | Resp 18 | Ht 63.0 in | Wt 212.8 lb

## 2021-03-31 DIAGNOSIS — Z1159 Encounter for screening for other viral diseases: Secondary | ICD-10-CM | POA: Diagnosis not present

## 2021-03-31 DIAGNOSIS — Z1211 Encounter for screening for malignant neoplasm of colon: Secondary | ICD-10-CM | POA: Diagnosis not present

## 2021-03-31 DIAGNOSIS — L600 Ingrowing nail: Secondary | ICD-10-CM

## 2021-03-31 DIAGNOSIS — R739 Hyperglycemia, unspecified: Secondary | ICD-10-CM

## 2021-03-31 DIAGNOSIS — Z0000 Encounter for general adult medical examination without abnormal findings: Secondary | ICD-10-CM | POA: Diagnosis not present

## 2021-03-31 LAB — COMPREHENSIVE METABOLIC PANEL
ALT: 13 U/L (ref 0–35)
AST: 13 U/L (ref 0–37)
Albumin: 4.2 g/dL (ref 3.5–5.2)
Alkaline Phosphatase: 65 U/L (ref 39–117)
BUN: 15 mg/dL (ref 6–23)
CO2: 30 mEq/L (ref 19–32)
Calcium: 9.3 mg/dL (ref 8.4–10.5)
Chloride: 107 mEq/L (ref 96–112)
Creatinine, Ser: 0.99 mg/dL (ref 0.40–1.20)
GFR: 67.05 mL/min (ref >60.00)
Glucose, Bld: 123 mg/dL — ABNORMAL HIGH (ref 70–99)
Potassium: 4 mEq/L (ref 3.5–5.1)
Sodium: 144 mEq/L (ref 135–145)
Total Bilirubin: 0.2 mg/dL (ref 0.2–1.2)
Total Protein: 6.8 g/dL (ref 6.0–8.3)

## 2021-03-31 LAB — LIPID PANEL
Cholesterol: 182 mg/dL (ref 0–200)
HDL: 59.5 mg/dL (ref >39.00)
LDL Cholesterol: 114 mg/dL — ABNORMAL HIGH (ref 0–99)
NonHDL: 122.41
Total CHOL/HDL Ratio: 3
Triglycerides: 43 mg/dL (ref 0.0–149.0)
VLDL: 8.6 mg/dL (ref 0.0–40.0)

## 2021-03-31 LAB — HEMOGLOBIN A1C: Hgb A1c MFr Bld: 5.8 % (ref 4.6–6.5)

## 2021-03-31 LAB — CBC
HCT: 37.4 % (ref 36.0–46.0)
Hemoglobin: 12.3 g/dL (ref 12.0–15.0)
MCHC: 32.8 g/dL (ref 30.0–36.0)
MCV: 89.1 fl (ref 78.0–100.0)
Platelets: 240 10*3/uL (ref 150.0–400.0)
RBC: 4.2 Mil/uL (ref 3.87–5.11)
RDW: 13.5 % (ref 11.5–15.5)
WBC: 6.2 10*3/uL (ref 4.0–10.5)

## 2021-03-31 NOTE — Assessment & Plan Note (Signed)
Flu shot yearly. Covid-19 needs booster counseled. Tetanus declines today. Colonoscopy referral to GI. Mammogram up to date, pap smear up to date. Counseled about sun safety and mole surveillance. Counseled about the dangers of distracted driving. Given 10 year screening recommendations.

## 2021-03-31 NOTE — Progress Notes (Signed)
   Subjective:   Patient ID: Rachel Graham, female    DOB: June 23, 1971, 50 y.o.   MRN: [id number]  HPI The patient is a 50 YO female coming in for physical.   PMH, Francis, social history reviewed and updated  Review of Systems  Constitutional: Negative.   HENT: Negative.   Eyes: Negative.   Respiratory: Negative for cough, chest tightness and shortness of breath.   Cardiovascular: Negative for chest pain, palpitations and leg swelling.  Gastrointestinal: Negative for abdominal distention, abdominal pain, constipation, diarrhea, nausea and vomiting.  Musculoskeletal: Negative.   Skin: Negative.   Neurological: Negative.   Psychiatric/Behavioral: Negative.     Objective:  Physical Exam Constitutional:      Appearance: She is well-developed.  HENT:     Head: Normocephalic and atraumatic.  Cardiovascular:     Rate and Rhythm: Normal rate and regular rhythm.  Pulmonary:     Effort: Pulmonary effort is normal. No respiratory distress.     Breath sounds: Normal breath sounds. No wheezing or rales.  Abdominal:     General: Bowel sounds are normal. There is no distension.     Palpations: Abdomen is soft.     Tenderness: There is no abdominal tenderness. There is no rebound.  Musculoskeletal:     Cervical back: Normal range of motion.  Skin:    General: Skin is warm and dry.  Neurological:     Mental Status: She is alert and oriented to person, place, and time.     Coordination: Coordination normal.     Vitals:   03/31/21 1501  BP: 124/70  Pulse: 61  Resp: 18  Temp: 98.2 F (36.8 C)  TempSrc: Oral  SpO2: 98%  Weight: 212 lb 12.8 oz (96.5 kg)  Height: 5\' 3"  (1.6 m)    This visit occurred during the SARS-CoV-2 public health emergency.  Safety protocols were in place, including screening questions prior to the visit, additional usage of staff PPE, and extensive cleaning of exam room while observing appropriate contact time as indicated for disinfecting solutions.    Assessment & Plan:

## 2021-03-31 NOTE — Patient Instructions (Addendum)
Vaccines.gov to find the booster closest to you.  We will get you in for the colonoscopy.    Health Maintenance, Female Adopting a healthy lifestyle and getting preventive care are important in promoting health and wellness. Ask your health care provider about:  The right schedule for you to have regular tests and exams.  Things you can do on your own to prevent diseases and keep yourself healthy. What should I know about diet, weight, and exercise? Eat a healthy diet  Eat a diet that includes plenty of vegetables, fruits, low-fat dairy products, and lean protein.  Do not eat a lot of foods that are high in solid fats, added sugars, or sodium.   Maintain a healthy weight Body mass index (BMI) is used to identify weight problems. It estimates body fat based on height and weight. Your health care provider can help determine your BMI and help you achieve or maintain a healthy weight. Get regular exercise Get regular exercise. This is one of the most important things you can do for your health. Most adults should:  Exercise for at least 150 minutes each week. The exercise should increase your heart rate and make you sweat (moderate-intensity exercise).  Do strengthening exercises at least twice a week. This is in addition to the moderate-intensity exercise.  Spend less time sitting. Even light physical activity can be beneficial. Watch cholesterol and blood lipids Have your blood tested for lipids and cholesterol at 49 years of age, then have this test every 5 years. Have your cholesterol levels checked more often if:  Your lipid or cholesterol levels are high.  You are older than 49 years of age.  You are at high risk for heart disease. What should I know about cancer screening? Depending on your health history and family history, you may need to have cancer screening at various ages. This may include screening for:  Breast cancer.  Cervical cancer.  Colorectal cancer.  Skin  cancer.  Lung cancer. What should I know about heart disease, diabetes, and high blood pressure? Blood pressure and heart disease  High blood pressure causes heart disease and increases the risk of stroke. This is more likely to develop in people who have high blood pressure readings, are of African descent, or are overweight.  Have your blood pressure checked: ? Every 3-5 years if you are 39-43 years of age. ? Every year if you are 68 years old or older. Diabetes Have regular diabetes screenings. This checks your fasting blood sugar level. Have the screening done:  Once every three years after age 69 if you are at a normal weight and have a low risk for diabetes.  More often and at a younger age if you are overweight or have a high risk for diabetes. What should I know about preventing infection? Hepatitis B If you have a higher risk for hepatitis B, you should be screened for this virus. Talk with your health care provider to find out if you are at risk for hepatitis B infection. Hepatitis C Testing is recommended for:  Everyone born from [birth year] through 1964.  Anyone with known risk factors for hepatitis C. Sexually transmitted infections (STIs)  Get screened for STIs, including gonorrhea and chlamydia, if: ? You are sexually active and are younger than 49 years of age. ? You are older than 49 years of age and your health care provider tells you that you are at risk for this type of infection. ? Your sexual activity has changed  since you were last screened, and you are at increased risk for chlamydia or gonorrhea. Ask your health care provider if you are at risk.  Ask your health care provider about whether you are at high risk for HIV. Your health care provider may recommend a prescription medicine to help prevent HIV infection. If you choose to take medicine to prevent HIV, you should first get tested for HIV. You should then be tested every 3 months for as long as you are taking  the medicine. Pregnancy  If you are about to stop having your period (premenopausal) and you may become pregnant, seek counseling before you get pregnant.  Take 400 to 800 micrograms (mcg) of folic acid every day if you become pregnant.  Ask for birth control (contraception) if you want to prevent pregnancy. Osteoporosis and menopause Osteoporosis is a disease in which the bones lose minerals and strength with aging. This can result in bone fractures. If you are 53 years old or older, or if you are at risk for osteoporosis and fractures, ask your health care provider if you should:  Be screened for bone loss.  Take a calcium or vitamin D supplement to lower your risk of fractures.  Be given hormone replacement therapy (HRT) to treat symptoms of menopause. Follow these instructions at home: Lifestyle  Do not use any products that contain nicotine or tobacco, such as cigarettes, e-cigarettes, and chewing tobacco. If you need help quitting, ask your health care provider.  Do not use street drugs.  Do not share needles.  Ask your health care provider for help if you need support or information about quitting drugs. Alcohol use  Do not drink alcohol if: ? Your health care provider tells you not to drink. ? You are pregnant, may be pregnant, or are planning to become pregnant.  If you drink alcohol: ? Limit how much you use to 0-1 drink a day. ? Limit intake if you are breastfeeding.  Be aware of how much alcohol is in your drink. In the [place] one drink equals one 12 oz bottle of beer (355 mL), one 5 oz glass of wine (148 mL), or one 1 oz glass of hard liquor (44 mL). General instructions  Schedule regular health, dental, and eye exams.  Stay current with your vaccines.  Tell your health care provider if: ? You often feel depressed. ? You have ever been abused or do not feel safe at home. Summary  Adopting a healthy lifestyle and getting preventive care are important in  promoting health and wellness.  Follow your health care provider's instructions about healthy diet, exercising, and getting tested or screened for diseases.  Follow your health care provider's instructions on monitoring your cholesterol and blood pressure. This information is not intended to replace advice given to you by your health care provider. Make sure you discuss any questions you have with your health care provider. Document Revised: 11/05/2018 Document Reviewed: 11/05/2018 Elsevier Patient Education  2021 [employer].

## 2021-03-31 NOTE — Assessment & Plan Note (Signed)
Multiple and referral done to podiatry.

## 2021-03-31 NOTE — Assessment & Plan Note (Signed)
Checking HgA1c. 

## 2021-04-03 LAB — HEPATITIS C ANTIBODY
Hepatitis C Ab: NONREACTIVE
SIGNAL TO CUT-OFF: 0.02 (ref <1.00)

## 2021-04-06 ENCOUNTER — Ambulatory Visit: Payer: 59 | Admitting: Podiatry

## 2021-04-06 ENCOUNTER — Other Ambulatory Visit: Payer: Self-pay

## 2021-04-06 DIAGNOSIS — B351 Tinea unguium: Secondary | ICD-10-CM | POA: Diagnosis not present

## 2021-04-06 DIAGNOSIS — Z79899 Other long term (current) drug therapy: Secondary | ICD-10-CM

## 2021-04-06 MED ORDER — TERBINAFINE HCL 250 MG PO TABS: 250.0000 mg | ORAL_TABLET | Freq: Every day | ORAL | 0 refills | Status: DC

## 2021-04-07 ENCOUNTER — Encounter: Payer: Self-pay | Admitting: Podiatry

## 2021-04-07 NOTE — Progress Notes (Signed)
  Subjective:  Patient ID: Rachel Graham, female    DOB: 1971-07-25,  MRN: [id number]  Chief Complaint  Patient presents with  . Ingrown Toenail    Bilateral     50 y.o. female presents with the above complaint.  Patient presents with complaint of thickened elongated dystrophic toenails x10.  They are painful to touch.  Some of them have become ingrown's because of how thick they are.  She has not tried any treatment options besides some over-the-counter medication.  She states been going for 3 months.  She would like to discuss treatment options for this.  She has not tried any oral medication she has not seen anyone else prior to seeing me.   Review of Systems: Negative except as noted in the HPI. Denies N/V/F/Ch.  Past Medical History:  Diagnosis Date  . Blood in stool   . Contact lens/glasses fitting    wears glases or contacts    Current Outpatient Medications:  .  terbinafine (LAMISIL) 250 MG tablet, Take 1 tablet (250 mg total) by mouth daily., Disp: 90 tablet, Rfl: 0  Social History   Tobacco Use  Smoking Status Never Smoker  Smokeless Tobacco Never Used    Allergies  Allergen Reactions  . Other     Hair dye   Objective:  There were no vitals filed for this visit. There is no height or weight on file to calculate BMI. Constitutional Well developed. Well nourished.  Vascular Dorsalis pedis pulses palpable bilaterally. Posterior tibial pulses palpable bilaterally. Capillary refill normal to all digits.  No cyanosis or clubbing noted. Pedal hair growth normal.  Neurologic Normal speech. Oriented to person, place, and time. Epicritic sensation to light touch grossly present bilaterally.  Dermatologic  thickened elongated dystrophic toenails x10 mild pain on palpation.  Mycotic in nature  Orthopedic: Normal joint ROM without pain or crepitus bilaterally. No visible deformities. No bony tenderness.   Radiographs: None Assessment:   1. Nail fungus    2. Onychomycosis   3. Encounter for long-term (current) use of high-risk medication    Plan:  Patient was evaluated and treated and all questions answered.  Onychomycosis/nail fungus x10 -Educated the patient on the etiology of onychomycosis and various treatment options associated with improving the fungal load.  I explained to the patient that there is 3 treatment options available to treat the onychomycosis including topical, p.o., laser treatment.  Patient elected to undergo p.o. options with Lamisil/terbinafine therapy.  Her lab work was assessed from few weeks ago.  She has normal liver function enzymes therefore she will benefit from Lamisil therapy.  90-day supply of Lamisil was sent to the pharmacy.   No follow-ups on file.

## 2021-04-19 ENCOUNTER — Other Ambulatory Visit: Payer: Self-pay

## 2021-04-19 ENCOUNTER — Ambulatory Visit
Admission: RE | Admit: 2021-04-19 | Discharge: 2021-04-19 | Disposition: A | Discharge status: Home / Self Care | Payer: 59 | Source: Ambulatory Visit | Attending: Obstetrics and Gynecology | Admitting: Obstetrics and Gynecology

## 2021-04-19 DIAGNOSIS — D360 Benign neoplasm of lymph nodes: Secondary | ICD-10-CM | POA: Diagnosis not present

## 2021-04-19 DIAGNOSIS — R928 Other abnormal and inconclusive findings on diagnostic imaging of breast: Secondary | ICD-10-CM

## 2021-08-09 ENCOUNTER — Ambulatory Visit: Payer: 59 | Admitting: Podiatry

## 2021-12-20 ENCOUNTER — Encounter: Payer: Self-pay | Admitting: Internal Medicine

## 2021-12-27 DIAGNOSIS — Z0100 Encounter for examination of eyes and vision without abnormal findings: Secondary | ICD-10-CM | POA: Diagnosis not present

## 2022-03-28 DIAGNOSIS — Z01419 Encounter for gynecological examination (general) (routine) without abnormal findings: Secondary | ICD-10-CM | POA: Diagnosis not present

## 2022-03-28 DIAGNOSIS — Z6837 Body mass index (BMI) 37.0-37.9, adult: Secondary | ICD-10-CM | POA: Diagnosis not present

## 2022-03-28 DIAGNOSIS — Z1389 Encounter for screening for other disorder: Secondary | ICD-10-CM | POA: Diagnosis not present

## 2022-03-28 DIAGNOSIS — Z1231 Encounter for screening mammogram for malignant neoplasm of breast: Secondary | ICD-10-CM | POA: Diagnosis not present

## 2022-03-28 DIAGNOSIS — Z130 Encounter for screening for diseases of the blood and blood-forming organs and certain disorders involving the immune mechanism: Secondary | ICD-10-CM | POA: Diagnosis not present

## 2022-06-07 DIAGNOSIS — H00024 Hordeolum internum left upper eyelid: Secondary | ICD-10-CM | POA: Diagnosis not present

## 2023-03-07 DIAGNOSIS — H5213 Myopia, bilateral: Secondary | ICD-10-CM | POA: Diagnosis not present

## 2023-08-20 ENCOUNTER — Other Ambulatory Visit (HOSPITAL_COMMUNITY): Payer: Self-pay

## 2024-04-07 ENCOUNTER — Encounter (HOSPITAL_COMMUNITY): Payer: Self-pay

## 2024-04-07 ENCOUNTER — Ambulatory Visit (HOSPITAL_COMMUNITY)
Admission: EM | Admit: 2024-04-07 | Discharge: 2024-04-07 | Disposition: A | Discharge status: Home / Self Care | Attending: Family Medicine | Admitting: Family Medicine

## 2024-04-07 DIAGNOSIS — T7840XA Allergy, unspecified, initial encounter: Secondary | ICD-10-CM | POA: Diagnosis not present

## 2024-04-07 DIAGNOSIS — L509 Urticaria, unspecified: Secondary | ICD-10-CM

## 2024-04-07 DIAGNOSIS — R220 Localized swelling, mass and lump, head: Secondary | ICD-10-CM | POA: Diagnosis not present

## 2024-04-07 MED ORDER — METHYLPREDNISOLONE SODIUM SUCC 125 MG IJ SOLR
125.0000 mg | Freq: Once | INTRAMUSCULAR | Status: AC
  Administered 2024-04-07: 125 mg via INTRAMUSCULAR

## 2024-04-07 MED ORDER — TRIAMCINOLONE ACETONIDE 0.1 % EX CREA: 1.0000 | TOPICAL_CREAM | Freq: Two times a day (BID) | CUTANEOUS | 0 refills | Status: AC

## 2024-04-07 MED ORDER — METHYLPREDNISOLONE SODIUM SUCC 125 MG IJ SOLR
INTRAMUSCULAR | Status: AC
  Filled 2024-04-07: qty 2

## 2024-04-07 MED ORDER — PREDNISONE 10 MG PO TABS
ORAL_TABLET | ORAL | 0 refills | Status: AC
Start: 2024-04-07 — End: 2024-04-19

## 2024-04-07 NOTE — Discharge Instructions (Addendum)
 Take Zyrtec daily for the next two weeks Benadryl  as needed for itching A super thin layer of steroid cream around your scalp line for the rash there Avoid exposure to heat/hot water Start the steroid taper tomorrow

## 2024-04-07 NOTE — ED Triage Notes (Signed)
 Pt states used a new hair dye on Sunday, scalp started itching on Monday and face swelling today. States this has happened before when changing hair dye. Denies taking any meds.

## 2024-04-07 NOTE — ED Provider Notes (Signed)
 [facility]    CSN: [id number] Arrival date & time: 04/07/24  1703    History   Chief Complaint Chief Complaint  Patient presents with   Allergic Reaction    HPI Rachel Graham is a 53 y.o. female.   Patient is here requesting evaluation for possible allergic reaction to some hair dye that she applied on Sunday.  Patient states on Monday her scalp became pruritic and today she developed facial and eye swelling.  No difficult breathing, chest pain, shortness of breath, difficulty swallowing or talking.  She has not taken any medications for the symptoms.  The history is provided by the patient.  Allergic Reaction Presenting symptoms: rash   Presenting symptoms: no difficulty swallowing and no wheezing     Past Medical History:  Diagnosis Date   Blood in stool    Contact lens/glasses fitting    wears glases or contacts    Patient Active Problem List   Diagnosis Date Noted   Ingrown toenail 03/31/2021   Hyperglycemia 06/16/2019   Patellofemoral syndrome of both knees 09/09/2017   Osteochondroma 05/31/2015   Allergic rhinitis 03/24/2015   Obesity 02/25/2015   Routine general medical examination at a health care facility 02/25/2015    Past Surgical History:  Procedure Laterality Date   DIAGNOSTIC LAPAROSCOPY  2010   lt ovarian cyst-LOA   fibroids surgery  2008   d/c hysteroscopy   GANGLION CYST EXCISION Right 04/27/2014   Procedure: RIGHT WRIST EXCISION OF MASS ;  Surgeon: Milagros Alf, MD;  Location: [facility];  Service: Orthopedics;  Laterality: Right;   INCISION AND DRAINAGE ABSCESS     buttocks- "boil"   ORIF ANKLE FRACTURE Left 12/04/2019   Procedure: OPEN REDUCTION INTERNAL FIXATION (ORIF) LEFT BIMALLEOLAR ANKLE FRACTURE;  Surgeon: Timothy Ford, MD;  Location: [facility];  Service: Orthopedics;  Laterality: Left;    OB History   No obstetric history on file.      Home Medications    Prior to Admission medications    Medication Sig Start Date End Date Taking? Authorizing Provider  predniSONE  (DELTASONE ) 10 MG tablet Take 6 tablets (60 mg total) by mouth daily with breakfast for 2 days, THEN 5 tablets (50 mg total) daily with breakfast for 2 days, THEN 4 tablets (40 mg total) daily with breakfast for 2 days, THEN 3 tablets (30 mg total) daily with breakfast for 2 days, THEN 2 tablets (20 mg total) daily with breakfast for 2 days, THEN 1 tablet (10 mg total) daily with breakfast for 2 days. 04/07/24 04/19/24 Yes Genene Kennel, FNP  triamcinolone  cream (KENALOG ) 0.1 % Apply 1 Application topically 2 (two) times daily for 3 days. 04/07/24 04/10/24 Yes Genene Kennel, FNP    Family History Family History  Problem Relation Age of Onset   Diabetes Mother    Diabetes Father    Diabetes Sister    Diabetes Brother     Social History Social History   Tobacco Use   Smoking status: Never   Smokeless tobacco: Never  Vaping Use   Vaping status: Never Used  Substance Use Topics   Alcohol use: Yes    Comment: occasional   Drug use: No     Allergies   Other   Review of Systems Review of Systems  Constitutional:  Negative for appetite change, fatigue and fever.  HENT:  Negative for congestion, rhinorrhea, sore throat, trouble swallowing and voice change.   Eyes:  Negative for  pain, redness and visual disturbance.  Respiratory:  Negative for cough, chest tightness, shortness of breath and wheezing.   Cardiovascular:  Negative for chest pain and palpitations.  Gastrointestinal:  Negative for abdominal pain, diarrhea, nausea and vomiting.  Genitourinary:  Negative for dysuria and urgency.  Musculoskeletal:  Negative for back pain and neck pain.  Skin:  Positive for rash.  Neurological:  Negative for dizziness and headaches.   Physical Exam Triage Vital Signs ED Triage Vitals  Encounter Vitals Group     BP 04/07/24 1825 120/79     Systolic BP Percentile --      Diastolic BP Percentile --      Pulse  Rate 04/07/24 1825 69     Resp 04/07/24 1825 18     Temp 04/07/24 1825 98 F (36.7 C)     Temp Source 04/07/24 1825 Oral     SpO2 04/07/24 1825 98 %     Weight --      Height --      Head Circumference --      Peak Flow --      Pain Score 04/07/24 1826 0     Pain Loc --      Pain Education --      Exclude from Growth Chart --    No data found.  Updated Vital Signs BP 120/79 (BP Location: Right Arm)   Pulse 69   Temp 98 F (36.7 C) (Oral)   Resp 18   LMP 11/27/2016   SpO2 98%   Physical Exam Vitals and nursing note reviewed.  Constitutional:      Appearance: Normal appearance.  HENT:     Head: Normocephalic.  Cardiovascular:     Rate and Rhythm: Normal rate and regular rhythm.     Heart sounds: Normal heart sounds.  Pulmonary:     Effort: Pulmonary effort is normal.     Breath sounds: Normal breath sounds.  Abdominal:     General: Bowel sounds are normal.  Skin:    General: Skin is warm and dry.     Findings: Erythema and rash present.     Comments: There is an erythematous pruritic urticarial rash to her diffuse scalp extending onto the diffuse forehead.  She has upper facial swelling to include bilateral periorbital regions (right greater than left).  The rash extends onto her neck.  Neurological:     General: No focal deficit present.     Mental Status: She is alert and oriented to person, place, and time.  Psychiatric:        Mood and Affect: Mood normal.        Behavior: Behavior normal.        Thought Content: Thought content normal.        Judgment: Judgment normal.      UC Treatments / Results  Labs (all labs ordered are listed, but only abnormal results are displayed) Labs Reviewed - No data to display  EKG   Radiology No results found.  Procedures Procedures (including critical care time)  Medications Ordered in UC Medications  methylPREDNISolone  sodium succinate (SOLU-MEDROL ) 125 mg/2 mL injection 125 mg (125 mg Intramuscular Given  04/07/24 1852)    Initial Impression / Assessment and Plan / UC Course  I have reviewed the triage vital signs and the nursing notes.  Pertinent labs & imaging results that were available during my care of the patient were reviewed by me and considered in my medical decision making (see chart for  details).     Patient is here requesting evaluation for new onset of rash and pruritus following treatment of her hair with a new dye and onset of pressures like 207.  The clonidine  on Sunday.  She has evidence of a contact dermatitis to diffuse scalp with facial/periorbital swelling.  Patient provided an injection of Solu-Medrol  here in clinic.  Plan to discharge home with extended steroid taper along with topical triamcinolone  to use for very brief time.  I discussed the use of Zyrtec and Benadryl .  Avoid exposure to heat or hot water.  Avoid use of this hair dye going forward.  Final Clinical Impressions(s) / UC Diagnoses   Final diagnoses:  Allergic reaction, initial encounter  Urticaria  Facial swelling     Discharge Instructions      Take Zyrtec daily for the next two weeks Benadryl  as needed for itching A super thin layer of steroid cream around your scalp line for the rash there Avoid exposure to heat/hot water Start the steroid taper tomorrow   ED Prescriptions     Medication Sig Dispense Auth. Provider   predniSONE  (DELTASONE ) 10 MG tablet Take 6 tablets (60 mg total) by mouth daily with breakfast for 2 days, THEN 5 tablets (50 mg total) daily with breakfast for 2 days, THEN 4 tablets (40 mg total) daily with breakfast for 2 days, THEN 3 tablets (30 mg total) daily with breakfast for 2 days, THEN 2 tablets (20 mg total) daily with breakfast for 2 days, THEN 1 tablet (10 mg total) daily with breakfast for 2 days. 42 tablet Genene Kennel, FNP   triamcinolone  cream (KENALOG ) 0.1 % Apply 1 Application topically 2 (two) times daily for 3 days. 30 g Genene Kennel, FNP      PDMP not  reviewed this encounter.   Genene Kennel, FNP 04/07/24 1900

## 2024-07-15 ENCOUNTER — Telehealth: Payer: Self-pay

## 2024-07-15 NOTE — Telephone Encounter (Signed)
 Patient is requesting to become a new patient

## 2024-07-15 NOTE — Telephone Encounter (Signed)
 Called patient back and informed her about this but also told her that we do have other providers in office that is taking on new patients. She verbalized that she will give our office a call back to schedule

## 2024-07-15 NOTE — Telephone Encounter (Signed)
 Not taking np currently sorry

## 2024-07-15 NOTE — Telephone Encounter (Signed)
 Copied from CRM #[id number]. Topic: Appointments - Appointment Scheduling >> Jul 15, 2024 12:09 PM Aleatha C wrote: Patient/patient representative is calling to schedule an appointment. Refer to attachments for appointment information.  Patient would like to know if she can be seen with the  Dr Rollene  she was her PCP previously and if so please give her a call back

## 2024-07-25 DIAGNOSIS — H5213 Myopia, bilateral: Secondary | ICD-10-CM | POA: Diagnosis not present

## 2024-10-27 NOTE — Patient Instructions (Incomplete)

## 2024-10-27 NOTE — Progress Notes (Signed)
 New Patient Visit  Subjective:     Patient ID: Rachel Graham, female    DOB: 1971-02-06, 53 y.o.   MRN: [id number]  Chief Complaint  Patient presents with   Establish Care    Headaches on right side ongoing 15m Has taken sinus medication that has helped some      HPI  Discussed the use of AI scribe software for clinical note transcription with the patient, who gave verbal consent to proceed.  History of Present Illness Rachel Graham is a 53 year old female who presents with daily headaches.  Cephalalgia (headache) - Constant headaches daily for the past three months, primarily on the right side - Excedrin or Aleve provides temporary relief, but headaches recur each morning - No improvement in headache symptoms after discontinuing contact lens use for one and a half months - Working on a computer does not alleviate headaches  Sinonasal symptoms - History of recurrent sinus infections and headaches - Suspects possible sinus etiology for current headaches - Frequent nose blowing - Occasional ear pain - Sinus medications are less effective than previously - No fever or gastrointestinal upset  Neurological symptoms - Longstanding tingling in fingers - No vision changes, although wears glasses and contacts  Blood pressure monitoring - Does not regularly monitor blood pressure     ROS Per HPI  No outpatient encounter medications on file as of 10/29/2024.   No facility-administered encounter medications on file as of 10/29/2024.    Past Medical History:  Diagnosis Date   Blood in stool    Contact lens/glasses fitting    wears glases or contacts    Past Surgical History:  Procedure Laterality Date   DIAGNOSTIC LAPAROSCOPY  2010   lt ovarian cyst-LOA   fibroids surgery  2008   d/c hysteroscopy   GANGLION CYST EXCISION Right 04/27/2014   Procedure: RIGHT WRIST EXCISION OF MASS ;  Surgeon: Franky JONELLE Curia, MD;  Location: [facility];  Service: Orthopedics;  Laterality: Right;   INCISION AND DRAINAGE ABSCESS     buttocks- boil   ORIF ANKLE FRACTURE Left 12/04/2019   Procedure: OPEN REDUCTION INTERNAL FIXATION (ORIF) LEFT BIMALLEOLAR ANKLE FRACTURE;  Surgeon: Harden Jerona GAILS, MD;  Location: [facility];  Service: Orthopedics;  Laterality: Left;    Family History  Problem Relation Age of Onset   Diabetes Mother    Diabetes Father    Diabetes Sister    Diabetes Brother     Social History   Socioeconomic History   Marital status: Married    Spouse name: Not on file   Number of children: Not on file   Years of education: Not on file   Highest education level: Not on file  Occupational History   Not on file  Tobacco Use   Smoking status: Never   Smokeless tobacco: Never  Vaping Use   Vaping status: Never Used  Substance and Sexual Activity   Alcohol use: Yes    Comment: occasional   Drug use: No   Sexual activity: Not Currently    Birth control/protection: None  Other Topics Concern   Not on file  Social History Narrative   Not on file   Social Drivers of Health   Financial Resource Strain: Not on file  Food Insecurity: Not on file  Transportation Needs: Not on file  Physical Activity: Not on file  Stress: Not on file  Social Connections: Not on file  Intimate Partner Violence: Not on  file       Objective:    BP 126/84   Pulse (!) 58   Temp 98.1 F (36.7 C) (Temporal)   Ht 5' 3 (1.6 m)   Wt 224 lb 3.2 oz (101.7 kg)   LMP 11/27/2016   SpO2 96%   BMI 39.72 kg/m    Physical Exam Vitals and nursing note reviewed.  Constitutional:      General: She is not in acute distress.    Appearance: Normal appearance.  HENT:     Head: Normocephalic and atraumatic.     Right Ear: External ear normal.     Left Ear: External ear normal.     Nose: Nose normal.     Mouth/Throat:     Mouth: Mucous membranes are moist.     Pharynx: Oropharynx is clear.  Eyes:     Extraocular Movements:  Extraocular movements intact.     Pupils: Pupils are equal, round, and reactive to light.  Cardiovascular:     Rate and Rhythm: Normal rate and regular rhythm.     Pulses: Normal pulses.     Heart sounds: Normal heart sounds.  Pulmonary:     Effort: Pulmonary effort is normal. No respiratory distress.     Breath sounds: Normal breath sounds. No wheezing, rhonchi or rales.  Musculoskeletal:        General: Normal range of motion.     Cervical back: Normal range of motion.     Right lower leg: No edema.     Left lower leg: No edema.  Lymphadenopathy:     Cervical: No cervical adenopathy.  Neurological:     General: No focal deficit present.     Mental Status: She is alert and oriented to person, place, and time.  Psychiatric:        Mood and Affect: Mood normal.        Thought Content: Thought content normal.     Results for orders placed or performed in visit on 10/29/24  CBC with Differential/Platelet  Result Value Ref Range   WBC 8.7 4.0 - 10.5 K/uL   RBC 4.47 3.87 - 5.11 Mil/uL   Hemoglobin 13.2 12.0 - 15.0 g/dL   HCT 59.5 63.9 - 53.9 %   MCV 90.4 78.0 - 100.0 fl   MCHC 32.8 30.0 - 36.0 g/dL   RDW 86.1 88.4 - 84.4 %   Platelets 270.0 150.0 - 400.0 K/uL   Neutrophils Relative % 49.4 43.0 - 77.0 %   Lymphocytes Relative 42.2 12.0 - 46.0 %   Monocytes Relative 5.7 3.0 - 12.0 %   Eosinophils Relative 1.8 0.0 - 5.0 %   Basophils Relative 0.9 0.0 - 3.0 %   Neutro Abs 4.3 1.4 - 7.7 K/uL   Lymphs Abs 3.7 0.7 - 4.0 K/uL   Monocytes Absolute 0.5 0.1 - 1.0 K/uL   Eosinophils Absolute 0.2 0.0 - 0.7 K/uL   Basophils Absolute 0.1 0.0 - 0.1 K/uL  Comprehensive metabolic panel with GFR  Result Value Ref Range   Sodium 143 135 - 145 mEq/L   Potassium 4.0 3.5 - 5.1 mEq/L   Chloride 106 96 - 112 mEq/L   CO2 28 19 - 32 mEq/L   Glucose, Bld 92 70 - 99 mg/dL   BUN 16 6 - 23 mg/dL   Creatinine, Ser 9.09 0.40 - 1.20 mg/dL   Total Bilirubin 0.3 0.2 - 1.2 mg/dL   Alkaline Phosphatase  67 39 - 117 U/L   AST 23  0 - 37 U/L   ALT 29 0 - 35 U/L   Total Protein 7.0 6.0 - 8.3 g/dL   Albumin 4.4 3.5 - 5.2 g/dL   GFR 26.69 >39.99 mL/min   Calcium 9.7 8.4 - 10.5 mg/dL  Lipid panel  Result Value Ref Range   Cholesterol 205 (H) 0 - 200 mg/dL   Triglycerides 59.9 0.0 - 149.0 mg/dL   HDL 24.59 >60.99 mg/dL   VLDL 8.0 0.0 - 59.9 mg/dL   LDL Cholesterol 877 (H) 0 - 99 mg/dL   Total CHOL/HDL Ratio 3    NonHDL 129.62   TSH  Result Value Ref Range   TSH 1.42 0.35 - 5.50 uIU/mL  Vitamin B12  Result Value Ref Range   Vitamin B-12 171 (L) 211 - 911 pg/mL  VITAMIN D  25 Hydroxy (Vit-D Deficiency, Fractures)  Result Value Ref Range   VITD 10.04 (L) 30.00 - 100.00 ng/mL        Assessment & Plan:   Assessment and Plan Assessment & Plan Chronic daily headache Chronic daily headaches relieved by Excedrin or Aleve. Differential includes sinus issues, hypertension, sleep apnea, neck pain, and vitamin deficiencies. Blood pressure slightly elevated. Tingling in fingers noted. - Ordered labs for electrolyte imbalances, anemia, and vitamin deficiencies. - Provided neck stretches. - Scheduled follow-up in 1-2 weeks.  Enlarged thyroid  Slight right thyroid  enlargement. Family history of thyroid  cancer. - Ordered thyroid  ultrasound. - Ordered thyroid  function tests.  Vitamin D  deficiency Previous deficiency treated with supplementation. - Ordered vitamin D  level.  Vitamin B12 deficiency Previous deficiency treated with supplementation. Tingling in fingers noted. - Ordered vitamin B12 level.  Encounter for Screening Lipid Disorders - lipid panel today  General Health Maintenance Family history of diabetes and cardiovascular issues. No personal history of diabetes. Desire for colonoscopy due to personal health concerns. - Ordered blood sugar test. - Scheduled colonoscopy.     Orders Placed This Encounter  Procedures   US  THYROID     [employer]ET[employer] ID#: [id number] EPIC WT:  224 NO NEEDS NO DEVICES PT AWARE 75 NO SHOW FEE KG AND PT    Standing Status:   Future    Expiration Date:   10/29/2025    Reason for Exam (SYMPTOM  OR DIAGNOSIS REQUIRED):   enlarged thyroid     Preferred imaging location?:   [facility]   CBC with Differential/Platelet    Release to patient:   Immediate [1]   Comprehensive metabolic panel with GFR    Release to patient:   Immediate [1]   Lipid panel   TSH   Vitamin B12   VITAMIN D  25 Hydroxy (Vit-D Deficiency, Fractures)   Ambulatory referral to Gastroenterology    Referral Priority:   Routine    Referral Type:   Consultation    Referral Reason:   Specialty Services Required    Number of Visits Requested:   1     No orders of the defined types were placed in this encounter.   Return in about 4 weeks (around 11/26/2024) for headache f/u.  Corean LITTIE Ku, FNP

## 2024-10-29 ENCOUNTER — Ambulatory Visit: Admitting: Family Medicine

## 2024-10-29 ENCOUNTER — Encounter: Payer: Self-pay | Admitting: Family Medicine

## 2024-10-29 VITALS — BP 126/84 | HR 58 | Temp 98.1°F | Ht 63.0 in | Wt 224.2 lb

## 2024-10-29 DIAGNOSIS — E049 Nontoxic goiter, unspecified: Secondary | ICD-10-CM

## 2024-10-29 DIAGNOSIS — Z13220 Encounter for screening for lipoid disorders: Secondary | ICD-10-CM | POA: Diagnosis not present

## 2024-10-29 DIAGNOSIS — E559 Vitamin D deficiency, unspecified: Secondary | ICD-10-CM | POA: Diagnosis not present

## 2024-10-29 DIAGNOSIS — E538 Deficiency of other specified B group vitamins: Secondary | ICD-10-CM

## 2024-10-29 DIAGNOSIS — Z1211 Encounter for screening for malignant neoplasm of colon: Secondary | ICD-10-CM

## 2024-10-29 DIAGNOSIS — R519 Headache, unspecified: Secondary | ICD-10-CM | POA: Diagnosis not present

## 2024-10-29 LAB — TSH: TSH: 1.42 u[IU]/mL (ref 0.35–5.50)

## 2024-10-29 LAB — COMPREHENSIVE METABOLIC PANEL WITH GFR
ALT: 29 U/L (ref 0–35)
AST: 23 U/L (ref 0–37)
Albumin: 4.4 g/dL (ref 3.5–5.2)
Alkaline Phosphatase: 67 U/L (ref 39–117)
BUN: 16 mg/dL (ref 6–23)
CO2: 28 meq/L (ref 19–32)
Calcium: 9.7 mg/dL (ref 8.4–10.5)
Chloride: 106 meq/L (ref 96–112)
Creatinine, Ser: 0.9 mg/dL (ref 0.40–1.20)
GFR: 73.3 mL/min (ref >60.00)
Glucose, Bld: 92 mg/dL (ref 70–99)
Potassium: 4 meq/L (ref 3.5–5.1)
Sodium: 143 meq/L (ref 135–145)
Total Bilirubin: 0.3 mg/dL (ref 0.2–1.2)
Total Protein: 7 g/dL (ref 6.0–8.3)

## 2024-10-29 LAB — CBC WITH DIFFERENTIAL/PLATELET
Basophils Absolute: 0.1 K/uL (ref 0.0–0.1)
Basophils Relative: 0.9 % (ref 0.0–3.0)
Eosinophils Absolute: 0.2 K/uL (ref 0.0–0.7)
Eosinophils Relative: 1.8 % (ref 0.0–5.0)
HCT: 40.4 % (ref 36.0–46.0)
Hemoglobin: 13.2 g/dL (ref 12.0–15.0)
Lymphocytes Relative: 42.2 % (ref 12.0–46.0)
Lymphs Abs: 3.7 K/uL (ref 0.7–4.0)
MCHC: 32.8 g/dL (ref 30.0–36.0)
MCV: 90.4 fl (ref 78.0–100.0)
Monocytes Absolute: 0.5 K/uL (ref 0.1–1.0)
Monocytes Relative: 5.7 % (ref 3.0–12.0)
Neutro Abs: 4.3 K/uL (ref 1.4–7.7)
Neutrophils Relative %: 49.4 % (ref 43.0–77.0)
Platelets: 270 K/uL (ref 150.0–400.0)
RBC: 4.47 Mil/uL (ref 3.87–5.11)
RDW: 13.8 % (ref 11.5–15.5)
WBC: 8.7 K/uL (ref 4.0–10.5)

## 2024-10-29 LAB — LIPID PANEL
Cholesterol: 205 mg/dL — ABNORMAL HIGH (ref 0–200)
HDL: 75.4 mg/dL (ref >39.00)
LDL Cholesterol: 122 mg/dL — ABNORMAL HIGH (ref 0–99)
NonHDL: 129.62
Total CHOL/HDL Ratio: 3
Triglycerides: 40 mg/dL (ref 0.0–149.0)
VLDL: 8 mg/dL (ref 0.0–40.0)

## 2024-10-29 LAB — VITAMIN B12: Vitamin B-12: 171 pg/mL — ABNORMAL LOW (ref 211–911)

## 2024-10-29 LAB — VITAMIN D 25 HYDROXY (VIT D DEFICIENCY, FRACTURES): VITD: 10.04 ng/mL — ABNORMAL LOW (ref 30.00–100.00)

## 2024-11-03 ENCOUNTER — Ambulatory Visit: Payer: Self-pay | Admitting: Family Medicine

## 2024-11-03 DIAGNOSIS — E041 Nontoxic single thyroid nodule: Secondary | ICD-10-CM

## 2024-11-03 DIAGNOSIS — E559 Vitamin D deficiency, unspecified: Secondary | ICD-10-CM

## 2024-11-03 MED ORDER — VITAMIN D (ERGOCALCIFEROL) 1.25 MG (50000 UNIT) PO CAPS: 50000.0000 [IU] | ORAL_CAPSULE | ORAL | 0 refills | Status: AC

## 2024-11-05 ENCOUNTER — Inpatient Hospital Stay: Admission: RE | Admit: 2024-11-05 | Discharge: 2024-11-05 | Attending: Family Medicine

## 2024-11-05 DIAGNOSIS — E049 Nontoxic goiter, unspecified: Secondary | ICD-10-CM

## 2024-11-05 DIAGNOSIS — E041 Nontoxic single thyroid nodule: Secondary | ICD-10-CM | POA: Diagnosis not present

## 2024-11-27 ENCOUNTER — Ambulatory Visit: Admitting: Internal Medicine

## 2024-12-01 ENCOUNTER — Ambulatory Visit: Admitting: Family Medicine

## 2024-12-01 ENCOUNTER — Encounter: Payer: Self-pay | Admitting: Family Medicine

## 2024-12-01 VITALS — BP 134/70 | HR 90 | Temp 97.8°F | Ht 63.0 in | Wt 222.6 lb

## 2024-12-01 DIAGNOSIS — R519 Headache, unspecified: Secondary | ICD-10-CM | POA: Diagnosis not present

## 2024-12-01 DIAGNOSIS — E538 Deficiency of other specified B group vitamins: Secondary | ICD-10-CM | POA: Diagnosis not present

## 2024-12-01 DIAGNOSIS — E049 Nontoxic goiter, unspecified: Secondary | ICD-10-CM | POA: Diagnosis not present

## 2024-12-01 DIAGNOSIS — Z6839 Body mass index (BMI) 39.0-39.9, adult: Secondary | ICD-10-CM

## 2024-12-01 DIAGNOSIS — E559 Vitamin D deficiency, unspecified: Secondary | ICD-10-CM

## 2024-12-01 NOTE — Progress Notes (Signed)
 "  Established Patient Office Visit  Subjective:     Patient ID: Rachel Graham, female    DOB: [birth year], 54 y.o.   MRN: [id number]  Chief Complaint  Patient presents with   Medical Management of Chronic Issues    Four week follow-up     HPI  Discussed the use of AI scribe software for clinical note transcription with the patient, who gave verbal consent to proceed.  History of Present Illness Rachel Graham is a 54 year old female who presents for a follow-up visit.  Cephalalgia - Chronic daily headaches with marked improvement since last visit - No associated neck pain - Continues vitamin D  supplementation  Fingertip paresthesia and desquamation - Peeling and numbness of fingertips lasted 3 to 4 days - Symptoms resolved spontaneously - Used topical moisturizers during episode - Taking B12 1000 mg daily - No other symptoms occurred  Weight management and thyroid  function - Interested in weight loss - Requests confirmation of normal thyroid  function prior to starting medication - Prefers non-injection options due to aversion to injections - Mother has history of diabetes     ROS Per HPI      Objective:    BP 134/70   Pulse 90   Temp 97.8 F (36.6 C)   Ht 5' 3 (1.6 m)   Wt 222 lb 9.6 oz (101 kg)   LMP 11/27/2016   SpO2 96%   BMI 39.43 kg/m    Physical Exam Vitals and nursing note reviewed.  Constitutional:      General: She is not in acute distress.    Appearance: Normal appearance. She is obese.  HENT:     Head: Normocephalic and atraumatic.     Right Ear: External ear normal.     Left Ear: External ear normal.     Nose: Nose normal.     Mouth/Throat:     Mouth: Mucous membranes are moist.     Pharynx: Oropharynx is clear.  Eyes:     Extraocular Movements: Extraocular movements intact.     Pupils: Pupils are equal, round, and reactive to light.  Cardiovascular:     Rate and Rhythm: Normal rate and regular rhythm.      Pulses: Normal pulses.     Heart sounds: Normal heart sounds.  Pulmonary:     Effort: Pulmonary effort is normal. No respiratory distress.     Breath sounds: Normal breath sounds. No wheezing, rhonchi or rales.  Musculoskeletal:        General: Normal range of motion.     Cervical back: Normal range of motion.     Right lower leg: No edema.     Left lower leg: No edema.  Lymphadenopathy:     Cervical: No cervical adenopathy.  Neurological:     General: No focal deficit present.     Mental Status: She is alert and oriented to person, place, and time.  Psychiatric:        Mood and Affect: Mood normal.        Thought Content: Thought content normal.     No results found for any visits on 12/01/24.  The 10-year ASCVD risk score (Arnett DK, et al., 2019) is: 2%  BP Readings from Last 3 Encounters:  12/01/24 134/70  10/29/24 126/84  04/07/24 120/79   Wt Readings from Last 3 Encounters:  12/01/24 222 lb 9.6 oz (101 kg)  10/29/24 224 lb 3.2 oz (101.7 kg)  03/31/21 212 lb 12.8 oz (96.5  kg)      Last CBC Lab Results  Component Value Date   WBC 8.7 10/29/2024   HGB 13.2 10/29/2024   HCT 40.4 10/29/2024   MCV 90.4 10/29/2024   MCH 28.9 12/04/2019   RDW 13.8 10/29/2024   PLT 270.0 10/29/2024   Last metabolic panel Lab Results  Component Value Date   GLUCOSE 92 10/29/2024   NA 143 10/29/2024   K 4.0 10/29/2024   CL 106 10/29/2024   CO2 28 10/29/2024   BUN 16 10/29/2024   CREATININE 0.90 10/29/2024   GFR 73.30 10/29/2024   CALCIUM 9.7 10/29/2024   PROT 7.0 10/29/2024   ALBUMIN 4.4 10/29/2024   BILITOT 0.3 10/29/2024   ALKPHOS 67 10/29/2024   AST 23 10/29/2024   ALT 29 10/29/2024   Last lipids Lab Results  Component Value Date   CHOL 205 (H) 10/29/2024   HDL 75.40 10/29/2024   LDLCALC 122 (H) 10/29/2024   TRIG 40.0 10/29/2024   CHOLHDL 3 10/29/2024   Last hemoglobin A1c Lab Results  Component Value Date   HGBA1C 5.8 03/31/2021   Last thyroid   functions Lab Results  Component Value Date   TSH 1.42 10/29/2024   Last vitamin D  Lab Results  Component Value Date   VD25OH 10.04 (L) 10/29/2024   Last vitamin B12 and Folate Lab Results  Component Value Date   VITAMINB12 171 (L) 10/29/2024       Assessment & Plan:   Assessment and Plan Assessment & Plan Chronic daily headache Headaches improved, likely due to vitamin D  supplementation. - Continue vitamin D  supplementation.  Enlarged thyroid  Thyroid  function assessment required before weight loss medication. Prefers oral or liquid medication forms. - Discuss weight loss medications post-normal thyroid  function confirmation.  Vitamin D  deficiency Improvement in headaches with vitamin D  supplementation. - Continue vitamin D  supplementation.  Vitamin B12 deficiency Taking 1000 mcg daily with resolved fingertip peeling. - Continue vitamin B12 supplementation.  BMI 39 - Continue efforts in healthy diet and activity level - Will address medications to help with weight loss after we receive results from thyroid  US , likely phentermine  General Health Maintenance Declined flu vaccination. - Offered flu vaccination.     No orders of the defined types were placed in this encounter.    No orders of the defined types were placed in this encounter.   Return if symptoms worsen or fail to improve.  Corean LITTIE Ku, FNP   "

## 2024-12-01 NOTE — Patient Instructions (Signed)
 Continue current medication regimen.   Follow up with specialists as scheduled.   We are checking labs today, will be in contact with any results that require further attention  Follow-up with me for new or worsening symptoms.

## 2024-12-28 ENCOUNTER — Inpatient Hospital Stay: Admission: RE | Admit: 2024-12-28 | Source: Ambulatory Visit

## 2024-12-28 ENCOUNTER — Encounter: Payer: Self-pay | Admitting: Gastroenterology

## 2024-12-30 ENCOUNTER — Encounter: Payer: Self-pay | Admitting: Internal Medicine

## 2025-01-06 ENCOUNTER — Other Ambulatory Visit

## 2025-01-21 ENCOUNTER — Encounter

## 2025-02-01 ENCOUNTER — Encounter: Admitting: Internal Medicine
